# Patient Record
Sex: Female | Born: 1940 | Race: Black or African American | Hispanic: No | Smoking: Never smoker
Health system: Southern US, Community
[De-identification: ages and names within clinical notes are randomized; demographics above are authoritative.]

## PROBLEM LIST (undated history)

## (undated) DIAGNOSIS — I639 Cerebral infarction, unspecified: Secondary | ICD-10-CM

## (undated) DIAGNOSIS — F0390 Unspecified dementia without behavioral disturbance: Secondary | ICD-10-CM

---

## 2018-03-29 ENCOUNTER — Inpatient Hospital Stay (HOSPITAL_COMMUNITY)
Admission: EM | Admit: 2018-03-29 | Discharge: 2018-04-05 | DRG: 291 | Disposition: A | Discharge status: Skilled Nursing Facility | Payer: Medicare Other | Attending: Internal Medicine | Admitting: Internal Medicine

## 2018-03-29 ENCOUNTER — Encounter (HOSPITAL_COMMUNITY): Payer: Self-pay | Admitting: Emergency Medicine

## 2018-03-29 ENCOUNTER — Emergency Department (HOSPITAL_COMMUNITY): Payer: Medicare Other

## 2018-03-29 ENCOUNTER — Observation Stay (HOSPITAL_BASED_OUTPATIENT_CLINIC_OR_DEPARTMENT_OTHER): Payer: Medicare Other

## 2018-03-29 DIAGNOSIS — I370 Nonrheumatic pulmonary valve stenosis: Secondary | ICD-10-CM | POA: Diagnosis not present

## 2018-03-29 DIAGNOSIS — I5023 Acute on chronic systolic (congestive) heart failure: Secondary | ICD-10-CM

## 2018-03-29 DIAGNOSIS — L89152 Pressure ulcer of sacral region, stage 2: Secondary | ICD-10-CM | POA: Diagnosis present

## 2018-03-29 DIAGNOSIS — N179 Acute kidney failure, unspecified: Secondary | ICD-10-CM | POA: Diagnosis present

## 2018-03-29 DIAGNOSIS — I340 Nonrheumatic mitral (valve) insufficiency: Secondary | ICD-10-CM | POA: Diagnosis not present

## 2018-03-29 DIAGNOSIS — G92 Toxic encephalopathy: Secondary | ICD-10-CM | POA: Diagnosis present

## 2018-03-29 DIAGNOSIS — I10 Essential (primary) hypertension: Secondary | ICD-10-CM | POA: Diagnosis not present

## 2018-03-29 DIAGNOSIS — N182 Chronic kidney disease, stage 2 (mild): Secondary | ICD-10-CM | POA: Diagnosis present

## 2018-03-29 DIAGNOSIS — I130 Hypertensive heart and chronic kidney disease with heart failure and stage 1 through stage 4 chronic kidney disease, or unspecified chronic kidney disease: Secondary | ICD-10-CM | POA: Diagnosis not present

## 2018-03-29 DIAGNOSIS — I447 Left bundle-branch block, unspecified: Secondary | ICD-10-CM | POA: Diagnosis present

## 2018-03-29 DIAGNOSIS — N390 Urinary tract infection, site not specified: Secondary | ICD-10-CM | POA: Diagnosis present

## 2018-03-29 DIAGNOSIS — F0390 Unspecified dementia without behavioral disturbance: Secondary | ICD-10-CM | POA: Diagnosis present

## 2018-03-29 DIAGNOSIS — Z8673 Personal history of transient ischemic attack (TIA), and cerebral infarction without residual deficits: Secondary | ICD-10-CM

## 2018-03-29 DIAGNOSIS — E1122 Type 2 diabetes mellitus with diabetic chronic kidney disease: Secondary | ICD-10-CM | POA: Diagnosis present

## 2018-03-29 DIAGNOSIS — N289 Disorder of kidney and ureter, unspecified: Secondary | ICD-10-CM

## 2018-03-29 DIAGNOSIS — I081 Rheumatic disorders of both mitral and tricuspid valves: Secondary | ICD-10-CM | POA: Diagnosis present

## 2018-03-29 DIAGNOSIS — I5043 Acute on chronic combined systolic (congestive) and diastolic (congestive) heart failure: Secondary | ICD-10-CM | POA: Diagnosis present

## 2018-03-29 DIAGNOSIS — L89629 Pressure ulcer of left heel, unspecified stage: Secondary | ICD-10-CM | POA: Diagnosis present

## 2018-03-29 DIAGNOSIS — Z79899 Other long term (current) drug therapy: Secondary | ICD-10-CM

## 2018-03-29 DIAGNOSIS — E876 Hypokalemia: Secondary | ICD-10-CM | POA: Diagnosis not present

## 2018-03-29 DIAGNOSIS — E119 Type 2 diabetes mellitus without complications: Secondary | ICD-10-CM | POA: Diagnosis not present

## 2018-03-29 DIAGNOSIS — I509 Heart failure, unspecified: Secondary | ICD-10-CM

## 2018-03-29 DIAGNOSIS — I428 Other cardiomyopathies: Secondary | ICD-10-CM | POA: Diagnosis present

## 2018-03-29 DIAGNOSIS — D696 Thrombocytopenia, unspecified: Secondary | ICD-10-CM | POA: Diagnosis present

## 2018-03-29 DIAGNOSIS — Z7984 Long term (current) use of oral hypoglycemic drugs: Secondary | ICD-10-CM

## 2018-03-29 HISTORY — DX: Unspecified dementia, unspecified severity, without behavioral disturbance, psychotic disturbance, mood disturbance, and anxiety: F03.90

## 2018-03-29 HISTORY — DX: Cerebral infarction, unspecified: I63.9

## 2018-03-29 LAB — GLUCOSE, CAPILLARY: Glucose-Capillary: 142 mg/dL — ABNORMAL HIGH (ref 70–99)

## 2018-03-29 LAB — CBC WITH DIFFERENTIAL/PLATELET
Abs Immature Granulocytes: 0.01 10*3/uL (ref 0.00–0.07)
Basophils Absolute: 0 10*3/uL (ref 0.0–0.1)
Basophils Relative: 1 %
Eosinophils Absolute: 0 10*3/uL (ref 0.0–0.5)
Eosinophils Relative: 1 %
HCT: 41.8 % (ref 36.0–46.0)
HEMOGLOBIN: 12.5 g/dL (ref 12.0–15.0)
Immature Granulocytes: 0 %
Lymphocytes Relative: 14 %
Lymphs Abs: 0.7 10*3/uL (ref 0.7–4.0)
MCH: 30.7 pg (ref 26.0–34.0)
MCHC: 29.9 g/dL — ABNORMAL LOW (ref 30.0–36.0)
MCV: 102.7 fL — ABNORMAL HIGH (ref 80.0–100.0)
Monocytes Absolute: 0.4 10*3/uL (ref 0.1–1.0)
Monocytes Relative: 8 %
Neutro Abs: 3.6 10*3/uL (ref 1.7–7.7)
Neutrophils Relative %: 76 %
Platelets: 95 10*3/uL — ABNORMAL LOW (ref 150–400)
RBC: 4.07 MIL/uL (ref 3.87–5.11)
RDW: 18.8 % — ABNORMAL HIGH (ref 11.5–15.5)
WBC: 4.8 10*3/uL (ref 4.0–10.5)
nRBC: 0 % (ref 0.0–0.2)

## 2018-03-29 LAB — COMPREHENSIVE METABOLIC PANEL
ALK PHOS: 80 U/L (ref 38–126)
ALT: 9 U/L (ref 0–44)
AST: 15 U/L (ref 15–41)
Albumin: 2.8 g/dL — ABNORMAL LOW (ref 3.5–5.0)
Anion gap: 15 (ref 5–15)
BUN: 26 mg/dL — ABNORMAL HIGH (ref 8–23)
CO2: 20 mmol/L — AB (ref 22–32)
Calcium: 8.9 mg/dL (ref 8.9–10.3)
Chloride: 107 mmol/L (ref 98–111)
Creatinine, Ser: 1.79 mg/dL — ABNORMAL HIGH (ref 0.44–1.00)
GFR calc Af Amer: 31 mL/min — ABNORMAL LOW (ref >60)
GFR calc non Af Amer: 27 mL/min — ABNORMAL LOW (ref >60)
Glucose, Bld: 143 mg/dL — ABNORMAL HIGH (ref 70–99)
Potassium: 4.2 mmol/L (ref 3.5–5.1)
SODIUM: 142 mmol/L (ref 135–145)
Total Bilirubin: 2.3 mg/dL — ABNORMAL HIGH (ref 0.3–1.2)
Total Protein: 6.8 g/dL (ref 6.5–8.1)

## 2018-03-29 LAB — CBG MONITORING, ED
Glucose-Capillary: 116 mg/dL — ABNORMAL HIGH (ref 70–99)
Glucose-Capillary: 128 mg/dL — ABNORMAL HIGH (ref 70–99)

## 2018-03-29 LAB — CREATININE, URINE, RANDOM: Creatinine, Urine: 102.38 mg/dL

## 2018-03-29 LAB — URINALYSIS, ROUTINE W REFLEX MICROSCOPIC
BILIRUBIN URINE: NEGATIVE
Glucose, UA: NEGATIVE mg/dL
Ketones, ur: NEGATIVE mg/dL
Nitrite: NEGATIVE
PH: 7 (ref 5.0–8.0)
Protein, ur: 30 mg/dL — AB
Specific Gravity, Urine: 1.011 (ref 1.005–1.030)

## 2018-03-29 LAB — SODIUM, URINE, RANDOM: Sodium, Ur: 45 mmol/L

## 2018-03-29 LAB — TROPONIN I: Troponin I: <0.03 ng/mL (ref <0.03)

## 2018-03-29 LAB — BRAIN NATRIURETIC PEPTIDE: B Natriuretic Peptide: >4500 pg/mL — ABNORMAL HIGH (ref 0.0–100.0)

## 2018-03-29 MED ORDER — ACETAMINOPHEN 325 MG PO TABS: 650.0000 mg | ORAL_TABLET | ORAL | Status: DC | PRN

## 2018-03-29 MED ORDER — FUROSEMIDE 10 MG/ML IJ SOLN
40.0000 mg | Freq: Two times a day (BID) | INTRAMUSCULAR | Status: DC
  Administered 2018-03-29 – 2018-03-30 (×3): 40 mg via INTRAVENOUS
  Filled 2018-03-29 (×3): qty 4

## 2018-03-29 MED ORDER — SODIUM CHLORIDE 0.9% FLUSH: 3.0000 mL | INTRAVENOUS | Status: DC | PRN

## 2018-03-29 MED ORDER — CARVEDILOL 3.125 MG PO TABS
3.1250 mg | ORAL_TABLET | Freq: Two times a day (BID) | ORAL | Status: DC
  Administered 2018-03-29 – 2018-04-05 (×13): 3.125 mg via ORAL
  Filled 2018-03-29 (×15): qty 1

## 2018-03-29 MED ORDER — INSULIN ASPART 100 UNIT/ML ~~LOC~~ SOLN: 0.0000 [IU] | Freq: Every day | SUBCUTANEOUS | Status: DC

## 2018-03-29 MED ORDER — SODIUM CHLORIDE 0.9 % IV SOLN
1.0000 g | INTRAVENOUS | Status: DC
  Administered 2018-03-30 – 2018-04-01 (×3): 1 g via INTRAVENOUS
  Filled 2018-03-29: qty 10
  Filled 2018-03-29: qty 1
  Filled 2018-03-29: qty 10

## 2018-03-29 MED ORDER — SODIUM CHLORIDE 0.9 % IV SOLN: 250.0000 mL | INTRAVENOUS | Status: DC | PRN

## 2018-03-29 MED ORDER — ONDANSETRON HCL 4 MG/2ML IJ SOLN: 4.0000 mg | Freq: Four times a day (QID) | INTRAMUSCULAR | Status: DC | PRN

## 2018-03-29 MED ORDER — SODIUM CHLORIDE 0.9 % IV SOLN
1.0000 g | Freq: Once | INTRAVENOUS | Status: AC
  Administered 2018-03-29: 1 g via INTRAVENOUS
  Filled 2018-03-29: qty 10

## 2018-03-29 MED ORDER — DONEPEZIL HCL 5 MG PO TABS
5.0000 mg | ORAL_TABLET | Freq: Every day | ORAL | Status: DC
  Administered 2018-03-30 – 2018-04-04 (×6): 5 mg via ORAL
  Filled 2018-03-29 (×7): qty 1

## 2018-03-29 MED ORDER — INSULIN ASPART 100 UNIT/ML ~~LOC~~ SOLN
0.0000 [IU] | Freq: Three times a day (TID) | SUBCUTANEOUS | Status: DC
  Administered 2018-03-29 – 2018-03-31 (×3): 1 [IU] via SUBCUTANEOUS
  Administered 2018-03-31 – 2018-04-02 (×3): 2 [IU] via SUBCUTANEOUS
  Administered 2018-04-03 – 2018-04-04 (×4): 1 [IU] via SUBCUTANEOUS

## 2018-03-29 MED ORDER — SODIUM CHLORIDE 0.9% FLUSH
3.0000 mL | Freq: Two times a day (BID) | INTRAVENOUS | Status: DC
  Administered 2018-03-29 – 2018-04-05 (×14): 3 mL via INTRAVENOUS

## 2018-03-29 MED ORDER — FUROSEMIDE 10 MG/ML IJ SOLN
40.0000 mg | Freq: Once | INTRAMUSCULAR | Status: AC
  Administered 2018-03-29: 40 mg via INTRAVENOUS
  Filled 2018-03-29: qty 4

## 2018-03-29 NOTE — Procedures (Signed)
Echo attempted. Patient just transferred to room. Patient agitated as nurse is attempted to put leads for heart monitor on her. Will attempt again later.

## 2018-03-29 NOTE — Progress Notes (Signed)
  Echocardiogram 2D Echocardiogram has been performed.  Kelly Valencia Kelly Valencia 03/29/2018, 4:50 PM

## 2018-03-29 NOTE — ED Notes (Signed)
bfast tray ordered 

## 2018-03-29 NOTE — ED Notes (Signed)
IV team consult ordered , unable to access peripheral IV due to poorly visible/minute veins .

## 2018-03-29 NOTE — Progress Notes (Signed)
Patient seen and examined, admitted this morning, H&P reviewed and agree with the assessment and plan.  In brief, this is a 77 year old female originally from [place] but now visiting the daughter in town, history of CHF, hypertension, diabetes, dementia who was brought by the daughter to the hospital due to increased confusion and lethargy.  She was found to have significant fluid overload in the ED as well as a UTI for which she was admitted.   BP 125/89 (BP Location: Left Arm)   Pulse 76   Resp 12   SpO2 98%  Has underlying dementia, appears in no distress, heart is regular, has significant pitting lower extremity edema.  Lungs are clear on anterior fields.  Acute on chronic CHF, unknown type -2D echo pending, continue IV Lasix, daily weights, I/O  UTI -Continue ceftriaxone, urine cultures are pending  Acute kidney injury -Creatinine 1.8 on admission, up from about 1 year ago, daughter tells me that at 1 point her kidneys "shut down" but they recovered so suspect a degree of chronic kidney disease, unknown stage  Thrombocytopenia -Monitor, unknown chronicity   Hypertension -Continue Coreg, she appears to be on ACE inhibitor, Entresto and losartan, unclear which when she is actually taking but strongly suspect Entresto.  Hold for now since she is getting diuresed  Type 2 diabetes mellitus, control undetermined -Continue sliding scale  Geran Haithcock M. Elvera Lennox, MD, PhD [facility] Pager [phone]  If 7PM-7AM, please contact night-coverage [email or web address] Password [email or web address]

## 2018-03-29 NOTE — ED Notes (Signed)
IV nurse at bedside attempting to insert peripheral IV .

## 2018-03-29 NOTE — ED Provider Notes (Addendum)
[facility] EMERGENCY DEPARTMENT Provider Note   CSN: [id number] Arrival date & time: 03/29/18  0024     History   Chief Complaint Chief Complaint  Patient presents with  . Generalized Weakness  . Dizziness    HPI Kelly Valencia is a 77 y.o. female.  Level 5 caveat for dementia.  Patient does not know why she is here.  She is oriented to person and place.  EMS reports family called for a 2-day history of dizziness and lightheadedness with generalized weakness.  Patient denies any falls.  No headache, neck pain, back pain, chest pain or abdominal pain.  Denies any nausea or vomiting.  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  No acute change in this today.  No urinary symptoms.  No other history available.  Patient's daughter has arrived.  She states the patient has had increasing generalized weakness and decreased urine output and appetite for the past 2 days.  She is more confused than usual and she is having difficulty trying to go to the bathroom and not wanting to eat or drink.  Daughter reports history of CHF, hypertension and previous diabetes.  Denies history of stroke.  Moved here from [place] on December 7.  The history is provided by the patient and the EMS personnel. The history is limited by the condition of the patient.    Past Medical History:  Diagnosis Date  . Dementia (HCC)   . Stroke [facility])     There are no active problems to display for this patient.   History reviewed. No pertinent surgical history.   OB History   No obstetric history on file.      Home Medications    Prior to Admission medications   Not on File    Family History No family history on file.  Social History Social History   Tobacco Use  . Smoking status: Never Smoker  . Smokeless tobacco: Never Used  Substance Use Topics  . Alcohol use: Never    Frequency: Never  . Drug use: Never     Allergies   Patient has no known  allergies.   Review of Systems Review of Systems  Unable to perform ROS: Dementia     Physical Exam Updated Vital Signs BP 126/88 (BP Location: Right Arm)   Pulse 88   Resp 18   SpO2 100%   Physical Exam Vitals signs and nursing note reviewed.  Constitutional:      General: She is not in acute distress.    Appearance: She is well-developed.  HENT:     Head: Normocephalic and atraumatic.     Mouth/Throat:     Pharynx: No oropharyngeal exudate.  Eyes:     Conjunctiva/sclera: Conjunctivae normal.     Comments: Left cornea chronically opacified  Neck:     Musculoskeletal: Normal range of motion and neck supple.     Comments: No meningismus. Cardiovascular:     Rate and Rhythm: Normal rate and regular rhythm.     Heart sounds: Normal heart sounds. No murmur.  Pulmonary:     Effort: Pulmonary effort is normal. No respiratory distress.     Breath sounds: Normal breath sounds.     Comments: Bibasilar crackles Abdominal:     Palpations: Abdomen is soft.     Tenderness: There is no abdominal tenderness. There is no guarding or rebound.     Comments: Induration to bilateral flanks without discrete mass or abscess or tenderness  Musculoskeletal:  Normal range of motion.        General: No tenderness.     Right lower leg: Edema present.     Left lower leg: Edema present.     Comments: Diffuse body wall edema  Skin:    General: Skin is warm.     Capillary Refill: Capillary refill takes less than 2 seconds.  Neurological:     Mental Status: She is alert.     Cranial Nerves: No cranial nerve deficit.     Motor: No abnormal muscle tone.     Coordination: Coordination normal.     Comments: Oriented to person and place.  Cranial nerves II to XII intact, 5/5 strength throughout.  Psychiatric:        Behavior: Behavior normal.      ED Treatments / Results  Labs (all labs ordered are listed, but only abnormal results are displayed) Labs Reviewed  CBC WITH  DIFFERENTIAL/PLATELET - Abnormal; Notable for the following components:      Result Value   MCV 102.7 (*)    MCHC 29.9 (*)    RDW 18.8 (*)    Platelets 95 (*)    All other components within normal limits  COMPREHENSIVE METABOLIC PANEL - Abnormal; Notable for the following components:   CO2 20 (*)    Glucose, Bld 143 (*)    BUN 26 (*)    Creatinine, Ser 1.79 (*)    Albumin 2.8 (*)    Total Bilirubin 2.3 (*)    GFR calc non Af Amer 27 (*)    GFR calc Af Amer 31 (*)    All other components within normal limits  URINALYSIS, ROUTINE W REFLEX MICROSCOPIC - Abnormal; Notable for the following components:   Color, Urine AMBER (*)    APPearance CLOUDY (*)    Hgb urine dipstick MODERATE (*)    Protein, ur 30 (*)    Leukocytes, UA LARGE (*)    WBC, UA >50 (*)    Bacteria, UA MANY (*)    Non Squamous Epithelial 0-5 (*)    All other components within normal limits  BRAIN NATRIURETIC PEPTIDE - Abnormal; Notable for the following components:   B Natriuretic Peptide >4,500.0 (*)    All other components within normal limits  URINE CULTURE  TROPONIN I  SODIUM, URINE, RANDOM  CREATININE, URINE, RANDOM  UREA NITROGEN, URINE    EKG EKG Interpretation  Date/Time:  Tuesday March 29 2018 00:27:25 EST Ventricular Rate:  85 PR Interval:  128 QRS Duration: 130 QT Interval:  432 QTC Calculation: 514 R Axis:   51 Text Interpretation:  Normal sinus rhythm Left bundle branch block Abnormal ECG No previous ECGs available Confirmed by Glynn Octave [phone]) on 03/29/2018 12:41:32 AM   Radiology Ct Abdomen Pelvis Wo Contrast  Result Date: 03/29/2018 CLINICAL DATA:  Abdominal distension. EXAM: CT ABDOMEN AND PELVIS WITHOUT CONTRAST TECHNIQUE: Multidetector CT imaging of the abdomen and pelvis was performed following the standard protocol without IV contrast. COMPARISON:  None. FINDINGS: Lower chest: Moderate bilateral pleural effusions with compressive atelectasis in the lower lobes. Opacity  in the lingula has the appearance of atelectasis. Multi chamber cardiomegaly with coronary artery calcifications. Hepatobiliary: No discrete focal hepatic lesion on noncontrast exam. High-density material in the region of the gallbladder fossa are presumed gallstones, however gallbladder is not well-defined. Pancreas: Streak artifact from overlying artifact obscures the pancreas. Spleen: Normal in size. Adrenals/Urinary Tract: Mild left adrenal thickening without nodule. The right adrenal gland is normal. Bilateral  renal parenchymal thinning. No hydronephrosis. Streak artifact from external artifact partially obscures the left kidney. Foley catheter decompresses the urinary bladder. Stomach/Bowel: Bowel evaluation is limited in the absence of contrast, streak artifact, and patient motion artifact. No evidence of bowel obstruction or inflammatory change. Normal appendix. Small to moderate colonic stool burden. Vascular/Lymphatic: Aortic atherosclerosis. No aneurysm. Significantly limited assessment for adenopathy. Reproductive: Uterus is unremarkable.  No gross adnexal mass. Other: Marked diffuse body wall edema and edema throughout the abdominal mesentery. Probable small volume ascites. No free air. Musculoskeletal: T12 and L3 superior endplate compression fractures, likely chronic. Multilevel degenerative change in the spine. There are no acute or suspicious osseous abnormalities. IMPRESSION: 1. Marked body wall and soft tissue edema. Bilateral pleural effusions, at least moderate in degree. Trace ascites. Findings consistent with third spacing or fluid overload. 2. Probable gallstones, gallbladder is not well-defined. 3.  Aortic Atherosclerosis (ICD10-I70.0). Electronically Signed   By: Narda Rutherford M.D.   On: 03/29/2018 05:40   Dg Chest 2 View  Result Date: 03/29/2018 CLINICAL DATA:  Weakness. EXAM: CHEST - 2 VIEW COMPARISON:  None. FINDINGS: The heart is enlarged. There are small to moderate bilateral  pleural effusions. Vascular congestion without overt pulmonary edema. No focal airspace disease. No pneumothorax. The bones are under mineralized. IMPRESSION: Cardiomegaly with small to moderate bilateral pleural effusions and vascular congestion. Findings consistent with fluid overload/CHF. Electronically Signed   By: Narda Rutherford M.D.   On: 03/29/2018 01:41   Ct Head Wo Contrast  Result Date: 03/29/2018 CLINICAL DATA:  Weakness and dizziness. Altered level of consciousness (LOC), unexplained; Polytrauma, critical, head/C-spine injury suspected EXAM: CT HEAD WITHOUT CONTRAST CT CERVICAL SPINE WITHOUT CONTRAST TECHNIQUE: Multidetector CT imaging of the head and cervical spine was performed following the standard protocol without intravenous contrast. Multiplanar CT image reconstructions of the cervical spine were also generated. COMPARISON:  None. FINDINGS: CT HEAD FINDINGS Brain: Generalized atrophy. Moderate to advanced chronic small vessel ischemia. Remote lacunar infarct in the left cerebellum and basal ganglia. No intracranial hemorrhage, mass effect, or midline shift. No hydrocephalus. The basilar cisterns are patent. No evidence of territorial infarct or acute ischemia. No extra-axial or intracranial fluid collection. Vascular: Atherosclerosis of skullbase vasculature without hyperdense vessel or abnormal calcification. Skull: No fracture or suspicious lesion. Probable focal hyperostosis in the right frontal region. Sinuses/Orbits: Bilateral globe deformity, more prominent on the left with intra-ocular high-density material, calcifications versus surgical material. Mild mucosal thickening of the paranasal sinuses. The mastoid air cells are clear. Postsurgical change of the nose. Other: None. CT CERVICAL SPINE FINDINGS Alignment: No traumatic subluxation. Trace anterolisthesis of C4 on C5 appears degenerative. Skull base and vertebrae: No acute fracture. Vertebral body heights are maintained. The dens  and skull base are intact. Soft tissues and spinal canal: No spinal canal hematoma. Fluid in the hypopharynx. Disc levels: Disc space narrowing and endplate spurring Z6-X0. Multilevel facet arthropathy. Upper chest: Large bilateral pleural effusions. Other: Carotid calcifications. IMPRESSION: 1. No acute intracranial abnormality. No skull fracture. 2. Generalized atrophy and chronic small vessel ischemia. 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 3. Degenerative change in the cervical spine without acute fracture or subluxation. 4. Bilateral pleural effusions, as seen on chest radiograph earlier this day. Electronically Signed   By: Narda Rutherford M.D.   On: 03/29/2018 05:11   Ct Cervical Spine Wo Contrast  Result Date: 03/29/2018 CLINICAL DATA:  Weakness and dizziness. Altered level of consciousness (LOC), unexplained; Polytrauma, critical, head/C-spine injury suspected  EXAM: CT HEAD WITHOUT CONTRAST CT CERVICAL SPINE WITHOUT CONTRAST TECHNIQUE: Multidetector CT imaging of the head and cervical spine was performed following the standard protocol without intravenous contrast. Multiplanar CT image reconstructions of the cervical spine were also generated. COMPARISON:  None. FINDINGS: CT HEAD FINDINGS Brain: Generalized atrophy. Moderate to advanced chronic small vessel ischemia. Remote lacunar infarct in the left cerebellum and basal ganglia. No intracranial hemorrhage, mass effect, or midline shift. No hydrocephalus. The basilar cisterns are patent. No evidence of territorial infarct or acute ischemia. No extra-axial or intracranial fluid collection. Vascular: Atherosclerosis of skullbase vasculature without hyperdense vessel or abnormal calcification. Skull: No fracture or suspicious lesion. Probable focal hyperostosis in the right frontal region. Sinuses/Orbits: Bilateral globe deformity, more prominent on the left with intra-ocular high-density material, calcifications versus  surgical material. Mild mucosal thickening of the paranasal sinuses. The mastoid air cells are clear. Postsurgical change of the nose. Other: None. CT CERVICAL SPINE FINDINGS Alignment: No traumatic subluxation. Trace anterolisthesis of C4 on C5 appears degenerative. Skull base and vertebrae: No acute fracture. Vertebral body heights are maintained. The dens and skull base are intact. Soft tissues and spinal canal: No spinal canal hematoma. Fluid in the hypopharynx. Disc levels: Disc space narrowing and endplate spurring Z6-X0. Multilevel facet arthropathy. Upper chest: Large bilateral pleural effusions. Other: Carotid calcifications. IMPRESSION: 1. No acute intracranial abnormality. No skull fracture. 2. Generalized atrophy and chronic small vessel ischemia. 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 3. Degenerative change in the cervical spine without acute fracture or subluxation. 4. Bilateral pleural effusions, as seen on chest radiograph earlier this day. Electronically Signed   By: Narda Rutherford M.D.   On: 03/29/2018 05:11    Procedures Procedures (including critical care time)  Medications Ordered in ED Medications - No data to display   Initial Impression / Assessment and Plan / ED Course  I have reviewed the triage vital signs and the nursing notes.  Pertinent labs & imaging results that were available during my care of the patient were reviewed by me and considered in my medical decision making (see chart for details).    Patient from home with generalized weakness, increased confusion, decreased urine output.  Daughter concerned about UTI.  Denies chest pain.  Appears to be volume overloaded on exam with anasarca.  She does not take any diuretics at home for unclear reasons.Labs show creatinine of 1.8, no baseline creatinine available.  Urinalysis consistent with UTI.  Will start Rocephin and send culture.  Chest x-ray confirms edema.  Will start IV  Lasix.  CT scan confirms body wall edema without acute pathology.  Patient will be admitted for IV diuresis and IV antibiotics for her UTI and encephalopathy.  CT head is stable.  Admission discussed with Dr. Antionette Char.  CRITICAL CARE Performed by: Glynn Octave Total critical care time: 31 minutes Critical care time was exclusive of separately billable procedures and treating other patients. Critical care was necessary to treat or prevent imminent or life-threatening deterioration. Critical care was time spent personally by me on the following activities: development of treatment plan with patient and/or surrogate as well as nursing, discussions with consultants, evaluation of patient's response to treatment, examination of patient, obtaining history from patient or surrogate, ordering and performing treatments and interventions, ordering and review of laboratory studies, ordering and review of radiographic studies, pulse oximetry and re-evaluation of patient's condition.   Final Clinical Impressions(s) / ED Diagnoses   Final diagnoses:  Acute on chronic  congestive heart failure, unspecified heart failure type [facility])  Urinary tract infection without hematuria, site unspecified    ED Discharge Orders    None       Carliyah Cotterman, Jeannett Senior, MD 03/29/18 0981    Glynn Octave, MD 04/14/18 [phone]

## 2018-03-29 NOTE — H&P (Signed)
History and Physical    Kelly Valencia WUJ:[id number] DOB: June 30, 1940 DOA: 03/29/2018  PCP: System, Pcp Not In   Patient coming from: Home   Chief Complaint: Increased confusion, lethargy    HPI: Kelly Valencia is a 77 y.o. female with medical history significant for CHF, hypertension, type 2 diabetes mellitus, and dementia, now presenting to the emergency department for evaluation of increased confusion and lethargy.  Patient lives with a daughter in [place], is here visiting her other daughter and was noted to be sleeping 20-22 hours per day since arriving in [place] on 03/19/18.  She has not been voicing any specific complaints.  Per the report of her daughter, she did has eaten very little and did not eat or drink anything 2 days ago. She did not urinate at all 2 days ago and urine yesterday was very dark and malodorous.  There was no recent fall or trauma reported.  Patient is not known to use alcohol or illicit substances.  ED Course: Upon arrival to the ED, patient is found to be saturating adequately on room air and with vitals otherwise stable.  EKG features a sinus rhythm with LBBB.  Chest x-ray is notable for small to moderate bilateral pleural effusions and vascular congestion consistent with CHF.  Chemistry panel is notable for creatinine 1.79, up from roughly 1 a year ago, and total bilirubin of 2.3.  CBC features a thrombocytopenia with platelets 95,000.  BNP is >4500 and troponin undetectable.  CT head is negative for acute findings.  CT abdomen and pelvis reveals anasarca and possible gallstones.  Patient was given 40 mg IV Lasix and Rocephin in the ED.  Urine was sent for culture.  She remains hemodynamically stable and will be observed in the hospital for ongoing evaluation and management.  Review of Systems:  All other systems reviewed and apart from HPI, are negative.  Past Medical History:  Diagnosis Date  . Dementia (HCC)   . Stroke [facility])     History reviewed.  No pertinent surgical history.   reports that she has never smoked. She has never used smokeless tobacco. She reports that she does not drink alcohol or use drugs.  No Known Allergies  Family History  Problem Relation Age of Onset  . Developmental delay Son      Prior to Admission medications   Medication Sig Start Date End Date Taking? Authorizing Provider  carvedilol (COREG) 3.125 MG tablet Take 3.125 mg by mouth 2 (two) times daily with a meal.   Yes [provider]  donepezil (ARICEPT) 5 MG tablet Take 5 mg by mouth at bedtime.   Yes [provider]  furosemide (LASIX) 40 MG tablet Take 40 mg by mouth daily.   Yes [provider]  lisinopril (PRINIVIL,ZESTRIL) 2.5 MG tablet Take 2.5 mg by mouth daily.   Yes [provider]  losartan (COZAAR) 50 MG tablet Take 50 mg by mouth daily.   Yes [provider]  metFORMIN (GLUCOPHAGE-XR) 500 MG 24 hr tablet Take 500 mg by mouth 2 (two) times daily.   Yes [provider]  sacubitril-valsartan (ENTRESTO) 49-51 MG Take 1 tablet by mouth 2 (two) times daily.   Yes [provider]    Physical Exam: Vitals:   03/29/18 0309 03/29/18 0309 03/29/18 0425 03/29/18 0601  BP: 118/79 118/79 119/86 113/65  Pulse: 86 94 78 74  Resp: (!) 25 20 18 17   SpO2: 100% 99% 100% 96%    Constitutional: NAD, somnolent  Eyes:  PERTLA, lids and conjunctivae normal ENMT: Mucous membranes are moist. Posterior pharynx clear of any exudate or lesions.   Neck: normal, supple, no masses, no thyromegaly Respiratory: clear to auscultation bilaterally, no wheezing, no crackles. Normal respiratory effort.   Cardiovascular: S1 & S2 heard, regular rate and rhythm. Bilateral leg edema. Neck veins distended throughout their visible course.   Abdomen: No distension, no tenderness, soft. Bowel sounds active.  Musculoskeletal: no clubbing / cyanosis. No joint deformity upper and lower extremities.    Skin: no  significant rashes, lesions, ulcers. Bullae at left heel. Neurologic: No facial asymmetry. Sensation intact. Moving all extremities.  Psychiatric: Somnolent, easily woken. Disoriented.    Labs on Admission: I have personally reviewed following labs and imaging studies  CBC: Recent Labs  Lab 03/29/18 0040  WBC 4.8  NEUTROABS 3.6  HGB 12.5  HCT 41.8  MCV 102.7*  PLT 95*   Basic Metabolic Panel: Recent Labs  Lab 03/29/18 0040  NA 142  K 4.2  CL 107  CO2 20*  GLUCOSE 143*  BUN 26*  CREATININE 1.79*  CALCIUM 8.9   GFR: CrCl cannot be calculated (Unknown ideal weight.). Liver Function Tests: Recent Labs  Lab 03/29/18 0040  AST 15  ALT 9  ALKPHOS 80  BILITOT 2.3*  PROT 6.8  ALBUMIN 2.8*   No results for input(s): LIPASE, AMYLASE in the last 168 hours. No results for input(s): AMMONIA in the last 168 hours. Coagulation Profile: No results for input(s): INR, PROTIME in the last 168 hours. Cardiac Enzymes: Recent Labs  Lab 03/29/18 0040  TROPONINI <0.03   BNP (last 3 results) No results for input(s): PROBNP in the last 8760 hours. HbA1C: No results for input(s): HGBA1C in the last 72 hours. CBG: No results for input(s): GLUCAP in the last 168 hours. Lipid Profile: No results for input(s): CHOL, HDL, LDLCALC, TRIG, CHOLHDL, LDLDIRECT in the last 72 hours. Thyroid Function Tests: No results for input(s): TSH, T4TOTAL, FREET4, T3FREE, THYROIDAB in the last 72 hours. Anemia Panel: No results for input(s): VITAMINB12, FOLATE, FERRITIN, TIBC, IRON, RETICCTPCT in the last 72 hours. Urine analysis:    Component Value Date/Time   COLORURINE AMBER (A) 03/29/2018 0245   APPEARANCEUR CLOUDY (A) 03/29/2018 0245   LABSPEC 1.011 03/29/2018 0245   PHURINE 7.0 03/29/2018 0245   GLUCOSEU NEGATIVE 03/29/2018 0245   HGBUR MODERATE (A) 03/29/2018 0245   BILIRUBINUR NEGATIVE 03/29/2018 0245   KETONESUR NEGATIVE 03/29/2018 0245   PROTEINUR 30 (A) 03/29/2018 0245   NITRITE  NEGATIVE 03/29/2018 0245   LEUKOCYTESUR LARGE (A) 03/29/2018 0245   Sepsis Labs: @LABRCNTIP (procalcitonin:4,lacticidven:4) )No results found for this or any previous visit (from the past 240 hour(s)).   Radiological Exams on Admission: Ct Abdomen Pelvis Wo Contrast  Result Date: 03/29/2018 CLINICAL DATA:  Abdominal distension. EXAM: CT ABDOMEN AND PELVIS WITHOUT CONTRAST TECHNIQUE: Multidetector CT imaging of the abdomen and pelvis was performed following the standard protocol without IV contrast. COMPARISON:  None. FINDINGS: Lower chest: Moderate bilateral pleural effusions with compressive atelectasis in the lower lobes. Opacity in the lingula has the appearance of atelectasis. Multi chamber cardiomegaly with coronary artery calcifications. Hepatobiliary: No discrete focal hepatic lesion on noncontrast exam. High-density material in the region of the gallbladder fossa are presumed gallstones, however gallbladder is not well-defined. Pancreas: Streak artifact from overlying artifact obscures the pancreas. Spleen: Normal in size. Adrenals/Urinary Tract: Mild left adrenal thickening without nodule. The right adrenal gland is normal. Bilateral renal parenchymal thinning. No hydronephrosis. Streak artifact  from external artifact partially obscures the left kidney. Foley catheter decompresses the urinary bladder. Stomach/Bowel: Bowel evaluation is limited in the absence of contrast, streak artifact, and patient motion artifact. No evidence of bowel obstruction or inflammatory change. Normal appendix. Small to moderate colonic stool burden. Vascular/Lymphatic: Aortic atherosclerosis. No aneurysm. Significantly limited assessment for adenopathy. Reproductive: Uterus is unremarkable.  No gross adnexal mass. Other: Marked diffuse body wall edema and edema throughout the abdominal mesentery. Probable small volume ascites. No free air. Musculoskeletal: T12 and L3 superior endplate compression fractures, likely  chronic. Multilevel degenerative change in the spine. There are no acute or suspicious osseous abnormalities. IMPRESSION: 1. Marked body wall and soft tissue edema. Bilateral pleural effusions, at least moderate in degree. Trace ascites. Findings consistent with third spacing or fluid overload. 2. Probable gallstones, gallbladder is not well-defined. 3.  Aortic Atherosclerosis (ICD10-I70.0). Electronically Signed   By: Narda Rutherford M.D.   On: 03/29/2018 05:40   Dg Chest 2 View  Result Date: 03/29/2018 CLINICAL DATA:  Weakness. EXAM: CHEST - 2 VIEW COMPARISON:  None. FINDINGS: The heart is enlarged. There are small to moderate bilateral pleural effusions. Vascular congestion without overt pulmonary edema. No focal airspace disease. No pneumothorax. The bones are under mineralized. IMPRESSION: Cardiomegaly with small to moderate bilateral pleural effusions and vascular congestion. Findings consistent with fluid overload/CHF. Electronically Signed   By: Narda Rutherford M.D.   On: 03/29/2018 01:41   Ct Head Wo Contrast  Result Date: 03/29/2018 CLINICAL DATA:  Weakness and dizziness. Altered level of consciousness (LOC), unexplained; Polytrauma, critical, head/C-spine injury suspected EXAM: CT HEAD WITHOUT CONTRAST CT CERVICAL SPINE WITHOUT CONTRAST TECHNIQUE: Multidetector CT imaging of the head and cervical spine was performed following the standard protocol without intravenous contrast. Multiplanar CT image reconstructions of the cervical spine were also generated. COMPARISON:  None. FINDINGS: CT HEAD FINDINGS Brain: Generalized atrophy. Moderate to advanced chronic small vessel ischemia. Remote lacunar infarct in the left cerebellum and basal ganglia. No intracranial hemorrhage, mass effect, or midline shift. No hydrocephalus. The basilar cisterns are patent. No evidence of territorial infarct or acute ischemia. No extra-axial or intracranial fluid collection. Vascular: Atherosclerosis of skullbase  vasculature without hyperdense vessel or abnormal calcification. Skull: No fracture or suspicious lesion. Probable focal hyperostosis in the right frontal region. Sinuses/Orbits: Bilateral globe deformity, more prominent on the left with intra-ocular high-density material, calcifications versus surgical material. Mild mucosal thickening of the paranasal sinuses. The mastoid air cells are clear. Postsurgical change of the nose. Other: None. CT CERVICAL SPINE FINDINGS Alignment: No traumatic subluxation. Trace anterolisthesis of C4 on C5 appears degenerative. Skull base and vertebrae: No acute fracture. Vertebral body heights are maintained. The dens and skull base are intact. Soft tissues and spinal canal: No spinal canal hematoma. Fluid in the hypopharynx. Disc levels: Disc space narrowing and endplate spurring O9-G2. Multilevel facet arthropathy. Upper chest: Large bilateral pleural effusions. Other: Carotid calcifications. IMPRESSION: 1. No acute intracranial abnormality. No skull fracture. 2. Generalized atrophy and chronic small vessel ischemia. 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 3. Degenerative change in the cervical spine without acute fracture or subluxation. 4. Bilateral pleural effusions, as seen on chest radiograph earlier this day. Electronically Signed   By: Narda Rutherford M.D.   On: 03/29/2018 05:11   Ct Cervical Spine Wo Contrast  Result Date: 03/29/2018 CLINICAL DATA:  Weakness and dizziness. Altered level of consciousness (LOC), unexplained; Polytrauma, critical, head/C-spine injury suspected EXAM: CT HEAD WITHOUT CONTRAST CT CERVICAL  SPINE WITHOUT CONTRAST TECHNIQUE: Multidetector CT imaging of the head and cervical spine was performed following the standard protocol without intravenous contrast. Multiplanar CT image reconstructions of the cervical spine were also generated. COMPARISON:  None. FINDINGS: CT HEAD FINDINGS Brain: Generalized atrophy. Moderate to  advanced chronic small vessel ischemia. Remote lacunar infarct in the left cerebellum and basal ganglia. No intracranial hemorrhage, mass effect, or midline shift. No hydrocephalus. The basilar cisterns are patent. No evidence of territorial infarct or acute ischemia. No extra-axial or intracranial fluid collection. Vascular: Atherosclerosis of skullbase vasculature without hyperdense vessel or abnormal calcification. Skull: No fracture or suspicious lesion. Probable focal hyperostosis in the right frontal region. Sinuses/Orbits: Bilateral globe deformity, more prominent on the left with intra-ocular high-density material, calcifications versus surgical material. Mild mucosal thickening of the paranasal sinuses. The mastoid air cells are clear. Postsurgical change of the nose. Other: None. CT CERVICAL SPINE FINDINGS Alignment: No traumatic subluxation. Trace anterolisthesis of C4 on C5 appears degenerative. Skull base and vertebrae: No acute fracture. Vertebral body heights are maintained. The dens and skull base are intact. Soft tissues and spinal canal: No spinal canal hematoma. Fluid in the hypopharynx. Disc levels: Disc space narrowing and endplate spurring Z6-X0. Multilevel facet arthropathy. Upper chest: Large bilateral pleural effusions. Other: Carotid calcifications. IMPRESSION: 1. No acute intracranial abnormality. No skull fracture. 2. Generalized atrophy and chronic small vessel ischemia. 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 3. Degenerative change in the cervical spine without acute fracture or subluxation. 4. Bilateral pleural effusions, as seen on chest radiograph earlier this day. Electronically Signed   By: Narda Rutherford M.D.   On: 03/29/2018 05:11    EKG: Independently reviewed. Sinus rhythm, LBBB.   Assessment/Plan   1. Acute on chronic CHF  - Presents with increased confusion and lethargy, found to have anasarca and UTI  - She has hx of CHF, but no echo  reports on file  - Treated in ED with Lasix 40 mg IV    - Continue diuresis with Lasix 40 mg IV q12h, continue cardiac monitoring, follow daily wt and I/O's, continue Coreg, hold ACE/ARB/Entresto in light of elevated creatinine with uncertain baseline, and check echocardiogram    2. UTI  - Presents with increased confusion, lethargy, and urinary retention  - Found to be in acute CHF with urinary retention and UA suggestive of UTI in this setting  - Foley was placed in ED, urine was sent for culture, and she was treated with empiric Rocephin  - Continue current antibiotic while following culture and clinical course   3. Renal insufficiency  - SCr is 1.79 on admission, up from ~1 a year ago  - No hydronephrosis on CT  - Check urine chemistries, hold ACE/ARB, renally-dose medications, follow daily chem panel during diuresis    4. Thrombocytopenia  - Platelets 95,000 on admission, worse than priors  - No bleeding identified  - Drop possibly from infection, possible nutritional deficiency  - Monitor    5. Hypertension  - BP at goal  - Continue Coreg, clarify ACE vs ARB vs Entresto as daughter reports patient taking all 3    6. Type II DM  - A1c was 10.7% in 2017  - Managed at home with metformin, held on admission  - Check CBG's and use a low-intensity SSI with Novolog while in hospital     DVT prophylaxis: SCD's  Code Status: Full  Family Communication: Daughter updated at bedside Consults called: None Admission status:  Observation    Briscoe Deutscher, MD [facility] Pager [phone]  If 7PM-7AM, please contact night-coverage [email or web address] Password [facility]  03/29/2018, 6:23 AM

## 2018-03-29 NOTE — ED Triage Notes (Signed)
Patient arrived with EMS from home reports generalized weakness with mild dizziness onset Saturday , history of Dementia and stroke with right sided deficits , denies pain/respirations unlabored .

## 2018-03-29 NOTE — ED Notes (Signed)
Patient transported to CT scan . 

## 2018-03-29 NOTE — ED Notes (Signed)
Unable to do Orthostatic Vitals. Pt unable to sit up on side of bed nor stand. Pt's legs are swollen and painful.

## 2018-03-29 NOTE — ED Notes (Signed)
Patient currently at X-ray .  

## 2018-03-30 DIAGNOSIS — E1122 Type 2 diabetes mellitus with diabetic chronic kidney disease: Secondary | ICD-10-CM | POA: Diagnosis present

## 2018-03-30 DIAGNOSIS — I428 Other cardiomyopathies: Secondary | ICD-10-CM | POA: Diagnosis present

## 2018-03-30 DIAGNOSIS — Z79899 Other long term (current) drug therapy: Secondary | ICD-10-CM | POA: Diagnosis not present

## 2018-03-30 DIAGNOSIS — I447 Left bundle-branch block, unspecified: Secondary | ICD-10-CM | POA: Diagnosis present

## 2018-03-30 DIAGNOSIS — Z8673 Personal history of transient ischemic attack (TIA), and cerebral infarction without residual deficits: Secondary | ICD-10-CM | POA: Diagnosis not present

## 2018-03-30 DIAGNOSIS — I5023 Acute on chronic systolic (congestive) heart failure: Secondary | ICD-10-CM | POA: Diagnosis not present

## 2018-03-30 DIAGNOSIS — N179 Acute kidney failure, unspecified: Secondary | ICD-10-CM | POA: Diagnosis present

## 2018-03-30 DIAGNOSIS — I509 Heart failure, unspecified: Secondary | ICD-10-CM

## 2018-03-30 DIAGNOSIS — G309 Alzheimer's disease, unspecified: Secondary | ICD-10-CM | POA: Diagnosis not present

## 2018-03-30 DIAGNOSIS — E119 Type 2 diabetes mellitus without complications: Secondary | ICD-10-CM | POA: Diagnosis not present

## 2018-03-30 DIAGNOSIS — I10 Essential (primary) hypertension: Secondary | ICD-10-CM | POA: Diagnosis not present

## 2018-03-30 DIAGNOSIS — L89629 Pressure ulcer of left heel, unspecified stage: Secondary | ICD-10-CM

## 2018-03-30 DIAGNOSIS — L89152 Pressure ulcer of sacral region, stage 2: Secondary | ICD-10-CM | POA: Diagnosis present

## 2018-03-30 DIAGNOSIS — F0390 Unspecified dementia without behavioral disturbance: Secondary | ICD-10-CM | POA: Diagnosis present

## 2018-03-30 DIAGNOSIS — G92 Toxic encephalopathy: Secondary | ICD-10-CM | POA: Diagnosis present

## 2018-03-30 DIAGNOSIS — N390 Urinary tract infection, site not specified: Secondary | ICD-10-CM

## 2018-03-30 DIAGNOSIS — I5043 Acute on chronic combined systolic (congestive) and diastolic (congestive) heart failure: Secondary | ICD-10-CM | POA: Diagnosis present

## 2018-03-30 DIAGNOSIS — I130 Hypertensive heart and chronic kidney disease with heart failure and stage 1 through stage 4 chronic kidney disease, or unspecified chronic kidney disease: Secondary | ICD-10-CM | POA: Diagnosis present

## 2018-03-30 DIAGNOSIS — N182 Chronic kidney disease, stage 2 (mild): Secondary | ICD-10-CM | POA: Diagnosis present

## 2018-03-30 DIAGNOSIS — F0280 Dementia in other diseases classified elsewhere without behavioral disturbance: Secondary | ICD-10-CM | POA: Diagnosis not present

## 2018-03-30 DIAGNOSIS — D696 Thrombocytopenia, unspecified: Secondary | ICD-10-CM | POA: Diagnosis present

## 2018-03-30 DIAGNOSIS — N289 Disorder of kidney and ureter, unspecified: Secondary | ICD-10-CM | POA: Diagnosis not present

## 2018-03-30 DIAGNOSIS — Z7984 Long term (current) use of oral hypoglycemic drugs: Secondary | ICD-10-CM | POA: Diagnosis not present

## 2018-03-30 DIAGNOSIS — E876 Hypokalemia: Secondary | ICD-10-CM | POA: Diagnosis not present

## 2018-03-30 DIAGNOSIS — I081 Rheumatic disorders of both mitral and tricuspid valves: Secondary | ICD-10-CM | POA: Diagnosis present

## 2018-03-30 LAB — GLUCOSE, CAPILLARY
Glucose-Capillary: 90 mg/dL (ref 70–99)
Glucose-Capillary: 116 mg/dL — ABNORMAL HIGH (ref 70–99)
Glucose-Capillary: 168 mg/dL — ABNORMAL HIGH (ref 70–99)

## 2018-03-30 LAB — CBC
HCT: 41.2 % (ref 36.0–46.0)
Hemoglobin: 13 g/dL (ref 12.0–15.0)
MCH: 31.1 pg (ref 26.0–34.0)
MCHC: 31.6 g/dL (ref 30.0–36.0)
MCV: 98.6 fL (ref 80.0–100.0)
PLATELETS: 92 10*3/uL — AB (ref 150–400)
RBC: 4.18 MIL/uL (ref 3.87–5.11)
RDW: 18.5 % — AB (ref 11.5–15.5)
WBC: 4 10*3/uL (ref 4.0–10.5)
nRBC: 0 % (ref 0.0–0.2)

## 2018-03-30 LAB — BASIC METABOLIC PANEL
Anion gap: 14 (ref 5–15)
BUN: 25 mg/dL — ABNORMAL HIGH (ref 8–23)
CO2: 21 mmol/L — AB (ref 22–32)
Calcium: 8.5 mg/dL — ABNORMAL LOW (ref 8.9–10.3)
Chloride: 109 mmol/L (ref 98–111)
Creatinine, Ser: 1.51 mg/dL — ABNORMAL HIGH (ref 0.44–1.00)
GFR calc Af Amer: 39 mL/min — ABNORMAL LOW (ref >60)
GFR calc non Af Amer: 33 mL/min — ABNORMAL LOW (ref >60)
Glucose, Bld: 98 mg/dL (ref 70–99)
Potassium: 4.4 mmol/L (ref 3.5–5.1)
Sodium: 144 mmol/L (ref 135–145)

## 2018-03-30 LAB — UREA NITROGEN, URINE: Urea Nitrogen, Ur: 218 mg/dL

## 2018-03-30 LAB — ECHOCARDIOGRAM COMPLETE
Height: 62 in
WEIGHTICAEL: 2924.18 [oz_av]

## 2018-03-30 MED ORDER — FUROSEMIDE 10 MG/ML IJ SOLN
80.0000 mg | Freq: Two times a day (BID) | INTRAMUSCULAR | Status: DC
  Administered 2018-03-31 – 2018-04-01 (×3): 80 mg via INTRAVENOUS
  Filled 2018-03-30 (×3): qty 8

## 2018-03-30 NOTE — Consult Note (Signed)
WOC Nurse wound consult note Reason for Consult: left heel partial thickness intact bulla Wound type:pressure Pressure Injury POA: Yes Measurement:6cm x 4cm above skin level serous filled intact blister. Wound bed:not seen Drainage (amount, consistency, odor) none Periwound: dry calloused skin Dressing procedure/placement/frequency:I have provided nurses with orders for foam dressing to protect intact bulla and Prevalon Boots to prevent further injuries. Nutritional level poor, Nutritional Consult recommended for optimal wound healing, ( pt also has wound on sacrum)  please order if you agree. We will not follow, but will remain available to this patient, to nursing, and the medical and/or surgical teams.  Please re-consult if we need to assist further.  Barnett Hatter, RN-C, WTA-C, OCA Wound Treatment Associate Ostomy Care Associate

## 2018-03-30 NOTE — Progress Notes (Signed)
Ptt had 5 beats run of V-Tach, denies any chest pain or discomfort. Paged MD on call via Amion. Currently, pt is refusing blood draw, agitated and irritable, saying that she will "sue the staff for messing with her".

## 2018-03-30 NOTE — Consult Note (Addendum)
Cardiology Consultation:   Patient ID: Kelly Valencia MRN: [id number]; DOB: February 20, 1941  Admit date: 03/29/2018 Date of Consult: 03/30/2018  Primary Care Provider: System, Pcp Not In Primary Cardiologist: No primary care provider on file.  [place] Primary Electrophysiologist:  None    Patient Profile:   Kelly Valencia is a 77 y.o. female with a hx of CHF, hypertension, diabetes and dementia who is being seen today for the evaluation of CHF at the request of Dr. Blake Divine.  History of Present Illness:   Kelly Valencia is from [place], visiting her daughter in [place].  She was brought into the hospital on 03/29/2018 by her daughter due to increased confusion and lethargy.  She was found to have significant volume overload in the ED as well as a UTI.  Kelly Valencia was asleep upon my entering the room.  After I awakened her she does not want to provide any information.  It is unclear whether she actually is capable of providing meaningful information.  She says yes to most of my questions but is unable to give any specific responses.  She does not want to be examined.  With my limited exam I note trace-1+ lower extremity edema.  I have a call into her daughter for more information.   Her pharmacy in [place], [facility], called to review most recent meds filled.  Entresto 49/51 TID- Dr. Jaynie Bream  [phone]) Lisinopril 2.5 mg qd filled 02/11/18 #90 Carvedilol 3.125 Losartan 50 mg daily last filled 12/25/17 #90 (Dr. Chancy Milroy same phone) Lasix 40 mg daily filled 02/14/18 #90 Aricept 5 mg X 1 mo then increase to 10 mg -filled 9/28 #90 (potassium and atorvastatin 80 last filled in July)  [facility] with [facility] was on 03/03/18. Record requested and received:       Hx: Systolic CHF with EF 10-20%, hypertensive heart disease, long-term current use of insulin, acute ischemic heart disease, CKD stage III, edema, carotid artery disease, dyslipidemia.   No evidence of coronary artery disease.      Medication list reveals carvedilol 3.125 mg twice daily, Lasix 40 mg daily, potassium chloride 20 mEq daily, Entresto 49-51 mg 3 times daily   Past Medical History:  Diagnosis Date  . Dementia (HCC)   . Stroke [facility])     History reviewed. No pertinent surgical history.   Home Medications:  Prior to Admission medications   Medication Sig Start Date End Date Taking? Authorizing Provider  carvedilol (COREG) 3.125 MG tablet Take 3.125 mg by mouth 2 (two) times daily with a meal.   Yes [provider]  donepezil (ARICEPT) 5 MG tablet Take 5 mg by mouth at bedtime.   Yes [provider]  furosemide (LASIX) 40 MG tablet Take 40 mg by mouth daily.   Yes [provider]  lisinopril (PRINIVIL,ZESTRIL) 2.5 MG tablet Take 2.5 mg by mouth daily.   Yes [provider]  losartan (COZAAR) 50 MG tablet Take 50 mg by mouth daily.   Yes [provider]  metFORMIN (GLUCOPHAGE-XR) 500 MG 24 hr tablet Take 500 mg by mouth 2 (two) times daily.   Yes [provider]  sacubitril-valsartan (ENTRESTO) 49-51 MG Take 1 tablet by mouth 2 (two) times daily.   Yes [provider]    Inpatient Medications: Scheduled Meds: . carvedilol  3.125 mg Oral BID WC  . donepezil  5 mg Oral QHS  . furosemide  40 mg Intravenous Q12H  . insulin aspart  0-5 Units  Subcutaneous QHS  . insulin aspart  0-9 Units Subcutaneous TID WC  . sodium chloride flush  3 mL Intravenous Q12H   Continuous Infusions: . sodium chloride    . cefTRIAXone (ROCEPHIN)  IV 1 g (03/30/18 0436)   PRN Meds: sodium chloride, acetaminophen, ondansetron (ZOFRAN) IV, sodium chloride flush  Allergies:   No Known Allergies  Social History:   Social History   Socioeconomic History  . Marital status: Widowed    Spouse name: Not on file  . Number of children: Not on file  . Years of education: Not on file  . Highest education level: Not on file    Occupational History  . Not on file  Social Needs  . Financial resource strain: Not on file  . Food insecurity:    Worry: Not on file    Inability: Not on file  . Transportation needs:    Medical: Not on file    Non-medical: Not on file  Tobacco Use  . Smoking status: Never Smoker  . Smokeless tobacco: Never Used  Substance and Sexual Activity  . Alcohol use: Never    Frequency: Never  . Drug use: Never  . Sexual activity: Not on file  Lifestyle  . Physical activity:    Days per week: Not on file    Minutes per session: Not on file  . Stress: Not on file  Relationships  . Social connections:    Talks on phone: Not on file    Gets together: Not on file    Attends religious service: Not on file    Active member of club or organization: Not on file    Attends meetings of clubs or organizations: Not on file    Relationship status: Not on file  . Intimate partner violence:    Fear of current or ex partner: Not on file    Emotionally abused: Not on file    Physically abused: Not on file    Forced sexual activity: Not on file  Other Topics Concern  . Not on file  Social History Narrative  . Not on file    Family History:    Family History  Problem Relation Age of Onset  . Developmental delay Son      ROS:  Please see the history of present illness.  ROS limited due to disorientation.   Physical Exam/Data:   Vitals:   03/30/18 0005 03/30/18 0023 03/30/18 0500 03/30/18 0552  BP: 108/68   117/83  Pulse: 66 68  68  Resp:    18  Temp:  97.9 F (36.6 C)  (!) 97.5 F (36.4 C)  TempSrc:  Oral  Oral  SpO2:  100%  95%  Weight:   85.1 kg   Height:        Intake/Output Summary (Last 24 hours) at 03/30/2018 1144 Last data filed at 03/30/2018 1011 Gross per 24 hour  Intake 340 ml  Output 1450 ml  Net -1110 ml   Filed Weights   03/29/18 1401 03/30/18 0500  Weight: 82.9 kg 85.1 kg   Body mass index is 34.31 kg/m.  General:  Elderly female, in no acute  distress HEENT: normal Neck: no JVD Vascular: pedal pulses 2+ Cardiac:  normal S1, S2; RRR;  Lungs:  clear to auscultation bilaterally, no wheezing, rhonchi or rales  Abd: soft, nontender, no hepatomegaly  Ext: Trace-1+lower leg edema Musculoskeletal:  No deformities, BUE and BLE strength normal and equal Skin: warm and dry  Neuro:  Disoriented  and uncooperative Psych:  Irritable   EKG:  The EKG was personally reviewed and demonstrates: Sinus rhythm with LBBB, no previous for comparison Telemetry:  Telemetry was personally reviewed and demonstrates:  Sinus rhythm with PVCs, PACs in the 60's with rare 3-5 beats NSVT  Relevant CV Studies:  Echocardiogram 03/29/2018 Study Conclusions - Left ventricle: The cavity size was mildly dilated. There was   mild concentric hypertrophy. Systolic function was normal. The   estimated ejection fraction was in the range of 10% to 15%. Wall   motion was normal; there were no regional wall motion   abnormalities. Doppler parameters are consistent with abnormal   left ventricular relaxation (grade 1 diastolic dysfunction).   Doppler parameters are consistent with elevated ventricular   end-diastolic filling pressure. - Ventricular septum: Septal motion showed paradox. - Mitral valve: There was severe regurgitation. - Left atrium: The atrium was moderately dilated. - Right ventricle: The cavity size was severely dilated. Wall   thickness was normal. Systolic function was severely reduced. - Right atrium: The atrium was moderately dilated. - Tricuspid valve: There was severe regurgitation. - Pulmonary arteries: Systolic pressure was moderately increased.   PA peak pressure: 58 mm Hg (S). - Pericardium, extracardiac: There was a large left pleural   effusion.  Impressions: - Severe biventricular systolic dysfunction. Severe mitral   regurgitation (reversal of forward flow in pulmonary veins),   severe tricuspid regurgitation, moderate pulmonary  hypertension.   End stage CHF, consider advanced HF consult.   Laboratory Data:  Chemistry Recent Labs  Lab 03/29/18 0040 03/30/18 0643  NA 142 144  K 4.2 4.4  CL 107 109  CO2 20* 21*  GLUCOSE 143* 98  BUN 26* 25*  CREATININE 1.79* 1.51*  CALCIUM 8.9 8.5*  GFRNONAA 27* 33*  GFRAA 31* 39*  ANIONGAP 15 14    Recent Labs  Lab 03/29/18 0040  PROT 6.8  ALBUMIN 2.8*  AST 15  ALT 9  ALKPHOS 80  BILITOT 2.3*   Hematology Recent Labs  Lab 03/29/18 0040 03/30/18 0643  WBC 4.8 4.0  RBC 4.07 4.18  HGB 12.5 13.0  HCT 41.8 41.2  MCV 102.7* 98.6  MCH 30.7 31.1  MCHC 29.9* 31.6  RDW 18.8* 18.5*  PLT 95* 92*   Cardiac Enzymes Recent Labs  Lab 03/29/18 0040  TROPONINI <0.03   No results for input(s): TROPIPOC in the last 168 hours.  BNP Recent Labs  Lab 03/29/18 0040  BNP >4,500.0*    DDimer No results for input(s): DDIMER in the last 168 hours.  Radiology/Studies:  Ct Abdomen Pelvis Wo Contrast  Result Date: 03/29/2018 CLINICAL DATA:  Abdominal distension. EXAM: CT ABDOMEN AND PELVIS WITHOUT CONTRAST TECHNIQUE: Multidetector CT imaging of the abdomen and pelvis was performed following the standard protocol without IV contrast. COMPARISON:  None. FINDINGS: Lower chest: Moderate bilateral pleural effusions with compressive atelectasis in the lower lobes. Opacity in the lingula has the appearance of atelectasis. Multi chamber cardiomegaly with coronary artery calcifications. Hepatobiliary: No discrete focal hepatic lesion on noncontrast exam. High-density material in the region of the gallbladder fossa are presumed gallstones, however gallbladder is not well-defined. Pancreas: Streak artifact from overlying artifact obscures the pancreas. Spleen: Normal in size. Adrenals/Urinary Tract: Mild left adrenal thickening without nodule. The right adrenal gland is normal. Bilateral renal parenchymal thinning. No hydronephrosis. Streak artifact from external artifact partially  obscures the left kidney. Foley catheter decompresses the urinary bladder. Stomach/Bowel: Bowel evaluation is limited in the absence of contrast, streak  artifact, and patient motion artifact. No evidence of bowel obstruction or inflammatory change. Normal appendix. Small to moderate colonic stool burden. Vascular/Lymphatic: Aortic atherosclerosis. No aneurysm. Significantly limited assessment for adenopathy. Reproductive: Uterus is unremarkable.  No gross adnexal mass. Other: Marked diffuse body wall edema and edema throughout the abdominal mesentery. Probable small volume ascites. No free air. Musculoskeletal: T12 and L3 superior endplate compression fractures, likely chronic. Multilevel degenerative change in the spine. There are no acute or suspicious osseous abnormalities. IMPRESSION: 1. Marked body wall and soft tissue edema. Bilateral pleural effusions, at least moderate in degree. Trace ascites. Findings consistent with third spacing or fluid overload. 2. Probable gallstones, gallbladder is not well-defined. 3.  Aortic Atherosclerosis (ICD10-I70.0). Electronically Signed   By: Narda Rutherford M.D.   On: 03/29/2018 05:40   Dg Chest 2 View  Result Date: 03/29/2018 CLINICAL DATA:  Weakness. EXAM: CHEST - 2 VIEW COMPARISON:  None. FINDINGS: The heart is enlarged. There are small to moderate bilateral pleural effusions. Vascular congestion without overt pulmonary edema. No focal airspace disease. No pneumothorax. The bones are under mineralized. IMPRESSION: Cardiomegaly with small to moderate bilateral pleural effusions and vascular congestion. Findings consistent with fluid overload/CHF. Electronically Signed   By: Narda Rutherford M.D.   On: 03/29/2018 01:41   Ct Head Wo Contrast  Result Date: 03/29/2018 CLINICAL DATA:  Weakness and dizziness. Altered level of consciousness (LOC), unexplained; Polytrauma, critical, head/C-spine injury suspected EXAM: CT HEAD WITHOUT CONTRAST CT CERVICAL SPINE WITHOUT  CONTRAST TECHNIQUE: Multidetector CT imaging of the head and cervical spine was performed following the standard protocol without intravenous contrast. Multiplanar CT image reconstructions of the cervical spine were also generated. COMPARISON:  None. FINDINGS: CT HEAD FINDINGS Brain: Generalized atrophy. Moderate to advanced chronic small vessel ischemia. Remote lacunar infarct in the left cerebellum and basal ganglia. No intracranial hemorrhage, mass effect, or midline shift. No hydrocephalus. The basilar cisterns are patent. No evidence of territorial infarct or acute ischemia. No extra-axial or intracranial fluid collection. Vascular: Atherosclerosis of skullbase vasculature without hyperdense vessel or abnormal calcification. Skull: No fracture or suspicious lesion. Probable focal hyperostosis in the right frontal region. Sinuses/Orbits: Bilateral globe deformity, more prominent on the left with intra-ocular high-density material, calcifications versus surgical material. Mild mucosal thickening of the paranasal sinuses. The mastoid air cells are clear. Postsurgical change of the nose. Other: None. CT CERVICAL SPINE FINDINGS Alignment: No traumatic subluxation. Trace anterolisthesis of C4 on C5 appears degenerative. Skull base and vertebrae: No acute fracture. Vertebral body heights are maintained. The dens and skull base are intact. Soft tissues and spinal canal: No spinal canal hematoma. Fluid in the hypopharynx. Disc levels: Disc space narrowing and endplate spurring Z6-X0. Multilevel facet arthropathy. Upper chest: Large bilateral pleural effusions. Other: Carotid calcifications. IMPRESSION: 1. No acute intracranial abnormality. No skull fracture. 2. Generalized atrophy and chronic small vessel ischemia. 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 3. Degenerative change in the cervical spine without acute fracture or subluxation. 4. Bilateral pleural effusions, as seen on chest  radiograph earlier this day. Electronically Signed   By: Narda Rutherford M.D.   On: 03/29/2018 05:11   Ct Cervical Spine Wo Contrast  Result Date: 03/29/2018 CLINICAL DATA:  Weakness and dizziness. Altered level of consciousness (LOC), unexplained; Polytrauma, critical, head/C-spine injury suspected EXAM: CT HEAD WITHOUT CONTRAST CT CERVICAL SPINE WITHOUT CONTRAST TECHNIQUE: Multidetector CT imaging of the head and cervical spine was performed following the standard protocol without intravenous contrast. Multiplanar CT image  reconstructions of the cervical spine were also generated. COMPARISON:  None. FINDINGS: CT HEAD FINDINGS Brain: Generalized atrophy. Moderate to advanced chronic small vessel ischemia. Remote lacunar infarct in the left cerebellum and basal ganglia. No intracranial hemorrhage, mass effect, or midline shift. No hydrocephalus. The basilar cisterns are patent. No evidence of territorial infarct or acute ischemia. No extra-axial or intracranial fluid collection. Vascular: Atherosclerosis of skullbase vasculature without hyperdense vessel or abnormal calcification. Skull: No fracture or suspicious lesion. Probable focal hyperostosis in the right frontal region. Sinuses/Orbits: Bilateral globe deformity, more prominent on the left with intra-ocular high-density material, calcifications versus surgical material. Mild mucosal thickening of the paranasal sinuses. The mastoid air cells are clear. Postsurgical change of the nose. Other: None. CT CERVICAL SPINE FINDINGS Alignment: No traumatic subluxation. Trace anterolisthesis of C4 on C5 appears degenerative. Skull base and vertebrae: No acute fracture. Vertebral body heights are maintained. The dens and skull base are intact. Soft tissues and spinal canal: No spinal canal hematoma. Fluid in the hypopharynx. Disc levels: Disc space narrowing and endplate spurring U9-W1. Multilevel facet arthropathy. Upper chest: Large bilateral pleural effusions.  Other: Carotid calcifications. IMPRESSION: 1. No acute intracranial abnormality. No skull fracture. 2. Generalized atrophy and chronic small vessel ischemia. 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 3. Degenerative change in the cervical spine without acute fracture or subluxation. 4. Bilateral pleural effusions, as seen on chest radiograph earlier this day. Electronically Signed   By: Narda Rutherford M.D.   On: 03/29/2018 05:11    Assessment and Plan:   Acute on chronic combined systolic and diastolic heart failure -Patient is followed at [facility] in [facility].  Last office visit on 03/03/2018, note has been requested to be faxed to [place].  Known systolic heart failure with EF 10-20%, chronic lower extremity edema, no prior evidence of coronary artery disease. -Echocardiogram 03/29/2018 showed severe biventricular systolic dysfunction, EF 10-15%, no regional wall motion abnormalities, grade 1 diastolic dysfunction, severe mitral regurgitation (reversal of forward flow into pulmonary veins), severe tricuspid regurgitation, moderate pulmonary hypertension.  End-stage CHF. -BNP >4,500. Troponin negative.  -Chest x-ray with cardiomegaly and small to moderate bilateral pleural effusions and vascular congestion, consistent with volume overload/CHF.  Also CT of the abdomen showed bilateral pleural effusions at least moderate in degree, trace ascites and findings consistent with third spacing/fluid overload. -Currently diuresing with Lasix 40 mg IV every 12 hours with modest UOP, 850 mls documented for yesterday. Wt is actually up 2 kg ? Accuracy.  -She is in no distress at this time, resting comfortably and breathing without difficulty.  She does have trace-1+ lower extremity edema. -Continuing on low-dose carvedilol. ACE-I/ARB/ARNI are on hold due to renal function (home  Med list indicates ACE & ARB & Entresto- I have a call into the daughter to  review her history and actual med list.  [place] [facility] [place] that patient is on Entresto 49-51 mg 3 times daily) -Increase lasix to 80 mg IV BID for better diuresis  CKD -Unknown baseline.  Serum creatinine was 1.79 on admission, 1.51 today -Avoiding nephrotoxins  Hypertension -Home medications list carvedilol 3.125 mg twice daily, Lasix 40 mg daily, lisinopril, losartan and Entresto.  She is currently continued on carvedilol, Lasix. -Blood pressures well controlled  UTI -Treatment with IV antibiotic per primary team -May be contributing to her confusion and being uncooperative vs baseline (unknown)  Diabetes type 2 -Sliding scale insulin  while hospitalized.  Unknown level of control.      For questions or updates, please contact [facility] Please consult [email or web address] for contact info under     Signed, Berton Bon, NP  03/30/2018 11:44 AM

## 2018-03-30 NOTE — Progress Notes (Signed)
PROGRESS NOTE    Kelly Valencia  ACZ:[id number] DOB: November 28, 1940 DOA: 03/29/2018 PCP: System, Pcp Not In    Brief Narrative:   77 year old female originally from [place] but now visiting the daughter in town, history of CHF, hypertension, diabetes, dementia who was brought by the daughter to the hospital due to increased confusion and lethargy.  She was found to have significant fluid overload in the ED as well as a UTI for which she was admitted.   Assessment & Plan:   Principal Problem:   CHF, acute on chronic (HCC) Active Problems:   Dementia (HCC)   Essential hypertension   Urinary tract infection without hematuria   Thrombocytopenia (HCC)   Diabetes mellitus type II, non insulin dependent (HCC)   Renal insufficiency   Pressure injury of skin of left heel   Acute metabolic/toxic encephalopathy Probably from urinary tract infection and acute on chronic systolic heart failure She is confused and has dementia at baseline.  As per the daughter at bedside she appears to be at baseline.   Acute on chronic systolic heart failure Echocardiogram shows left ventricular ejection fraction around 10 to 15%.  She is on IV Lasix for diuresis.  Continue with daily weights, strict intake and output and monitor urine output.  Watch renal parameters and potassium while on IV Lasix.  Cardiology consulted and recommendations given.    Acute urinary tract infection Urine culture showing 100,000 gram-negative rods.  Further identification and sensitivities are pending.  Currently patient is on IV Rocephin, continue the same.   Pressure injury to left heel present on admission wound care consulted and recommendations given.    Chronic kidney disease unclear what stage.  We will continue to monitor.  Type 2 diabetes mellitus Continue with sliding scale insulin while in the hospital.   Dementia Continue with Aricept   Hypertension Continue with Coreg    DVT prophylaxis:  SCDs Code Status: Full code Family Communication: Discussed with daughter at bedside disposition Plan: Pending clinical improvement   Consultants:   Cardiology Dr. Duke Salvia  Procedures: Echocardiogram Antimicrobials: None  Subjective: He denies any chest pain or shortness of breath at this time  Objective: Vitals:   03/30/18 0023 03/30/18 0500 03/30/18 0552 03/30/18 1636  BP:   117/83 (!) 96/55  Pulse: 68  68   Resp:   18 18  Temp: 97.9 F (36.6 C)  (!) 97.5 F (36.4 C) (!) 97.3 F (36.3 C)  TempSrc: Oral  Oral   SpO2: 100%  95% 96%  Weight:  85.1 kg    Height:        Intake/Output Summary (Last 24 hours) at 03/30/2018 1757 Last data filed at 03/30/2018 1430 Gross per 24 hour  Intake 340 ml  Output 1450 ml  Net -1110 ml   Filed Weights   03/29/18 1401 03/30/18 0500  Weight: 82.9 kg 85.1 kg    Examination:  General exam: Calm, legally blind Respiratory system: Clear to auscultation. Respiratory effort normal. Cardiovascular system: S1 & S2 heard, RRR.  JVD present  gastrointestinal system: Abdomen is nondistended, soft and nontender. No organomegaly or masses felt. Normal bowel sounds heard. Central nervous system: Alert but confused. Extremities: Pedal edema present Skin: No rashes, lesions or ulcers Psychiatry:  Mood & affect appropriate.     Data Reviewed: I have personally reviewed following labs and imaging studies  CBC: Recent Labs  Lab 03/29/18 0040 03/30/18 0643  WBC 4.8 4.0  NEUTROABS 3.6  --   HGB 12.5 13.0  HCT 41.8 41.2  MCV 102.7* 98.6  PLT 95* 92*   Basic Metabolic Panel: Recent Labs  Lab 03/29/18 0040 03/30/18 0643  NA 142 144  K 4.2 4.4  CL 107 109  CO2 20* 21*  GLUCOSE 143* 98  BUN 26* 25*  CREATININE 1.79* 1.51*  CALCIUM 8.9 8.5*   GFR: Estimated Creatinine Clearance: 32.1 mL/min (A) (by C-G formula based on SCr of 1.51 mg/dL (H)). Liver Function Tests: Recent Labs  Lab 03/29/18 0040  AST 15  ALT 9  ALKPHOS  80  BILITOT 2.3*  PROT 6.8  ALBUMIN 2.8*   No results for input(s): LIPASE, AMYLASE in the last 168 hours. No results for input(s): AMMONIA in the last 168 hours. Coagulation Profile: No results for input(s): INR, PROTIME in the last 168 hours. Cardiac Enzymes: Recent Labs  Lab 03/29/18 0040  TROPONINI <0.03   BNP (last 3 results) No results for input(s): PROBNP in the last 8760 hours. HbA1C: No results for input(s): HGBA1C in the last 72 hours. CBG: Recent Labs  Lab 03/29/18 0745 03/29/18 1118 03/29/18 2120 03/30/18 0747 03/30/18 1632  GLUCAP 116* 128* 142* 90 116*   Lipid Profile: No results for input(s): CHOL, HDL, LDLCALC, TRIG, CHOLHDL, LDLDIRECT in the last 72 hours. Thyroid Function Tests: No results for input(s): TSH, T4TOTAL, FREET4, T3FREE, THYROIDAB in the last 72 hours. Anemia Panel: No results for input(s): VITAMINB12, FOLATE, FERRITIN, TIBC, IRON, RETICCTPCT in the last 72 hours. Sepsis Labs: No results for input(s): PROCALCITON, LATICACIDVEN in the last 168 hours.  Recent Results (from the past 240 hour(s))  Urine Culture     Status: Abnormal (Preliminary result)   Collection Time: 03/29/18  2:45 AM  Result Value Ref Range Status   Specimen Description URINE, CATHETERIZED  Final   Special Requests NONE  Final   Culture (A)  Final    >=100,000 COLONIES/mL GRAM NEGATIVE RODS CULTURE REINCUBATED FOR BETTER GROWTH Performed at [facility], [address], [place], [place] [address]    Report Status PENDING  Incomplete         Radiology Studies: Ct Abdomen Pelvis Wo Contrast  Result Date: 03/29/2018 CLINICAL DATA:  Abdominal distension. EXAM: CT ABDOMEN AND PELVIS WITHOUT CONTRAST TECHNIQUE: Multidetector CT imaging of the abdomen and pelvis was performed following the standard protocol without IV contrast. COMPARISON:  None. FINDINGS: Lower chest: Moderate bilateral pleural effusions with compressive atelectasis in the lower lobes. Opacity  in the lingula has the appearance of atelectasis. Multi chamber cardiomegaly with coronary artery calcifications. Hepatobiliary: No discrete focal hepatic lesion on noncontrast exam. High-density material in the region of the gallbladder fossa are presumed gallstones, however gallbladder is not well-defined. Pancreas: Streak artifact from overlying artifact obscures the pancreas. Spleen: Normal in size. Adrenals/Urinary Tract: Mild left adrenal thickening without nodule. The right adrenal gland is normal. Bilateral renal parenchymal thinning. No hydronephrosis. Streak artifact from external artifact partially obscures the left kidney. Foley catheter decompresses the urinary bladder. Stomach/Bowel: Bowel evaluation is limited in the absence of contrast, streak artifact, and patient motion artifact. No evidence of bowel obstruction or inflammatory change. Normal appendix. Small to moderate colonic stool burden. Vascular/Lymphatic: Aortic atherosclerosis. No aneurysm. Significantly limited assessment for adenopathy. Reproductive: Uterus is unremarkable.  No gross adnexal mass. Other: Marked diffuse body wall edema and edema throughout the abdominal mesentery. Probable small volume ascites. No free air. Musculoskeletal: T12 and L3 superior endplate compression fractures, likely chronic. Multilevel degenerative change in the spine. There are no  acute or suspicious osseous abnormalities. IMPRESSION: 1. Marked body wall and soft tissue edema. Bilateral pleural effusions, at least moderate in degree. Trace ascites. Findings consistent with third spacing or fluid overload. 2. Probable gallstones, gallbladder is not well-defined. 3.  Aortic Atherosclerosis (ICD10-I70.0). Electronically Signed   By: Narda Rutherford M.D.   On: 03/29/2018 05:40   Dg Chest 2 View  Result Date: 03/29/2018 CLINICAL DATA:  Weakness. EXAM: CHEST - 2 VIEW COMPARISON:  None. FINDINGS: The heart is enlarged. There are small to moderate bilateral  pleural effusions. Vascular congestion without overt pulmonary edema. No focal airspace disease. No pneumothorax. The bones are under mineralized. IMPRESSION: Cardiomegaly with small to moderate bilateral pleural effusions and vascular congestion. Findings consistent with fluid overload/CHF. Electronically Signed   By: Narda Rutherford M.D.   On: 03/29/2018 01:41   Ct Head Wo Contrast  Result Date: 03/29/2018 CLINICAL DATA:  Weakness and dizziness. Altered level of consciousness (LOC), unexplained; Polytrauma, critical, head/C-spine injury suspected EXAM: CT HEAD WITHOUT CONTRAST CT CERVICAL SPINE WITHOUT CONTRAST TECHNIQUE: Multidetector CT imaging of the head and cervical spine was performed following the standard protocol without intravenous contrast. Multiplanar CT image reconstructions of the cervical spine were also generated. COMPARISON:  None. FINDINGS: CT HEAD FINDINGS Brain: Generalized atrophy. Moderate to advanced chronic small vessel ischemia. Remote lacunar infarct in the left cerebellum and basal ganglia. No intracranial hemorrhage, mass effect, or midline shift. No hydrocephalus. The basilar cisterns are patent. No evidence of territorial infarct or acute ischemia. No extra-axial or intracranial fluid collection. Vascular: Atherosclerosis of skullbase vasculature without hyperdense vessel or abnormal calcification. Skull: No fracture or suspicious lesion. Probable focal hyperostosis in the right frontal region. Sinuses/Orbits: Bilateral globe deformity, more prominent on the left with intra-ocular high-density material, calcifications versus surgical material. Mild mucosal thickening of the paranasal sinuses. The mastoid air cells are clear. Postsurgical change of the nose. Other: None. CT CERVICAL SPINE FINDINGS Alignment: No traumatic subluxation. Trace anterolisthesis of C4 on C5 appears degenerative. Skull base and vertebrae: No acute fracture. Vertebral body heights are maintained. The dens  and skull base are intact. Soft tissues and spinal canal: No spinal canal hematoma. Fluid in the hypopharynx. Disc levels: Disc space narrowing and endplate spurring Z6-X0. Multilevel facet arthropathy. Upper chest: Large bilateral pleural effusions. Other: Carotid calcifications. IMPRESSION: 1. No acute intracranial abnormality. No skull fracture. 2. Generalized atrophy and chronic small vessel ischemia. 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 3. Degenerative change in the cervical spine without acute fracture or subluxation. 4. Bilateral pleural effusions, as seen on chest radiograph earlier this day. Electronically Signed   By: Narda Rutherford M.D.   On: 03/29/2018 05:11   Ct Cervical Spine Wo Contrast  Result Date: 03/29/2018 CLINICAL DATA:  Weakness and dizziness. Altered level of consciousness (LOC), unexplained; Polytrauma, critical, head/C-spine injury suspected EXAM: CT HEAD WITHOUT CONTRAST CT CERVICAL SPINE WITHOUT CONTRAST TECHNIQUE: Multidetector CT imaging of the head and cervical spine was performed following the standard protocol without intravenous contrast. Multiplanar CT image reconstructions of the cervical spine were also generated. COMPARISON:  None. FINDINGS: CT HEAD FINDINGS Brain: Generalized atrophy. Moderate to advanced chronic small vessel ischemia. Remote lacunar infarct in the left cerebellum and basal ganglia. No intracranial hemorrhage, mass effect, or midline shift. No hydrocephalus. The basilar cisterns are patent. No evidence of territorial infarct or acute ischemia. No extra-axial or intracranial fluid collection. Vascular: Atherosclerosis of skullbase vasculature without hyperdense vessel or abnormal calcification. Skull: No fracture  or suspicious lesion. Probable focal hyperostosis in the right frontal region. Sinuses/Orbits: Bilateral globe deformity, more prominent on the left with intra-ocular high-density material, calcifications versus  surgical material. Mild mucosal thickening of the paranasal sinuses. The mastoid air cells are clear. Postsurgical change of the nose. Other: None. CT CERVICAL SPINE FINDINGS Alignment: No traumatic subluxation. Trace anterolisthesis of C4 on C5 appears degenerative. Skull base and vertebrae: No acute fracture. Vertebral body heights are maintained. The dens and skull base are intact. Soft tissues and spinal canal: No spinal canal hematoma. Fluid in the hypopharynx. Disc levels: Disc space narrowing and endplate spurring Z6-X0. Multilevel facet arthropathy. Upper chest: Large bilateral pleural effusions. Other: Carotid calcifications. IMPRESSION: 1. No acute intracranial abnormality. No skull fracture. 2. Generalized atrophy and chronic small vessel ischemia. 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 3. Degenerative change in the cervical spine without acute fracture or subluxation. 4. Bilateral pleural effusions, as seen on chest radiograph earlier this day. Electronically Signed   By: Narda Rutherford M.D.   On: 03/29/2018 05:11        Scheduled Meds: . carvedilol  3.125 mg Oral BID WC  . donepezil  5 mg Oral QHS  . [START ON 03/31/2018] furosemide  80 mg Intravenous Q12H  . insulin aspart  0-5 Units Subcutaneous QHS  . insulin aspart  0-9 Units Subcutaneous TID WC  . sodium chloride flush  3 mL Intravenous Q12H   Continuous Infusions: . sodium chloride    . cefTRIAXone (ROCEPHIN)  IV 1 g (03/30/18 0436)     LOS: 0 days    Time spent 38 minutes    Kathlen Mody, MD [facility] Pager [phone] If 7PM-7AM, please contact night-coverage [email or web address] Password [facility] 03/30/2018, 5:57 PM

## 2018-03-30 NOTE — Plan of Care (Signed)
Pt alert but confused. Refused bedtime meds, yelling out  "call police, I just want to sleep". Daughter came by later and was able to talk her into taking her midnight medication. Per report, pt refused to eat at dinner time.

## 2018-03-31 DIAGNOSIS — N289 Disorder of kidney and ureter, unspecified: Secondary | ICD-10-CM

## 2018-03-31 DIAGNOSIS — I10 Essential (primary) hypertension: Secondary | ICD-10-CM

## 2018-03-31 LAB — BASIC METABOLIC PANEL
Anion gap: 12 (ref 5–15)
BUN: 22 mg/dL (ref 8–23)
CO2: 25 mmol/L (ref 22–32)
Calcium: 8.3 mg/dL — ABNORMAL LOW (ref 8.9–10.3)
Chloride: 106 mmol/L (ref 98–111)
Creatinine, Ser: 1.29 mg/dL — ABNORMAL HIGH (ref 0.44–1.00)
GFR calc Af Amer: 47 mL/min — ABNORMAL LOW (ref >60)
GFR calc non Af Amer: 40 mL/min — ABNORMAL LOW (ref >60)
Glucose, Bld: 188 mg/dL — ABNORMAL HIGH (ref 70–99)
Potassium: 3.1 mmol/L — ABNORMAL LOW (ref 3.5–5.1)
Sodium: 143 mmol/L (ref 135–145)

## 2018-03-31 LAB — GLUCOSE, CAPILLARY
GLUCOSE-CAPILLARY: 129 mg/dL — AB (ref 70–99)
Glucose-Capillary: 142 mg/dL — ABNORMAL HIGH (ref 70–99)
Glucose-Capillary: 157 mg/dL — ABNORMAL HIGH (ref 70–99)

## 2018-03-31 MED ORDER — POTASSIUM CHLORIDE CRYS ER 20 MEQ PO TBCR
40.0000 meq | EXTENDED_RELEASE_TABLET | Freq: Two times a day (BID) | ORAL | Status: AC
  Administered 2018-03-31 (×2): 40 meq via ORAL
  Filled 2018-03-31 (×2): qty 2

## 2018-03-31 NOTE — Progress Notes (Signed)
RN rounded on pt. Pt states she does not need anything at this time. 

## 2018-03-31 NOTE — Plan of Care (Signed)
  Problem: Activity: Goal: Capacity to carry out activities will improve Outcome: Progressing   Problem: Cardiac: Goal: Ability to achieve and maintain adequate cardiopulmonary perfusion will improve Outcome: Progressing   

## 2018-03-31 NOTE — Progress Notes (Signed)
PROGRESS NOTE    Kelly Valencia  ZOX:[id number] DOB: Aug 11, 1940 DOA: 03/29/2018 PCP: System, Pcp Not In    Brief Narrative:   77 year old female originally from [place] but now visiting the daughter in town, history of CHF, hypertension, diabetes, dementia who was brought by the daughter to the hospital due to increased confusion and lethargy.  She was found to have significant fluid overload in the ED as well as a UTI for which she was admitted.   Assessment & Plan:   Principal Problem:   CHF, acute on chronic (HCC) Active Problems:   Dementia (HCC)   Essential hypertension   Urinary tract infection without hematuria   Thrombocytopenia (HCC)   Diabetes mellitus type II, non insulin dependent (HCC)   Renal insufficiency   Pressure injury of skin of left heel   Acute metabolic/toxic encephalopathy Probably from urinary tract infection and acute on chronic systolic heart failure. She is confused and has dementia at baseline.  As per the daughter at bedside she appears to be at baseline. No agitation overnight.   Acute on chronic systolic heart failure Echocardiogram shows left ventricular ejection fraction around 10 to 15%.  She is on IV Lasix for diuresis.  Continue with daily weights, strict intake and output and monitor urine output.  Watch renal parameters and potassium while on IV Lasix.  Cardiology consulted and recommendations given. She has diuresed about 2 lit since admission.      Acute urinary tract infection Urine culture showing 100,000 gram-negative rods.  Further identification and sensitivities are pending.  Currently patient is on IV Rocephin, continue the same.   Pressure injury to left heel present on admission wound care consulted and recommendations given.    Chronic kidney disease unclear what stage.  Improving with diuresis.  We will continue to monitor.  Type 2 diabetes mellitus Continue with sliding scale insulin while in the  hospital. CBG (last 3)  Recent Labs    03/30/18 1632 03/30/18 2157 03/31/18 0748  GLUCAP 116* 168* 142*      Dementia Continue with Aricept   Hypertension Continue with Coreg  Hypokalemia Replaced repeat in am.     DVT prophylaxis: SCDs Code Status: Full code Family Communication: None  at bedside disposition Plan: Pending clinical improvement   Consultants:   Cardiology Dr. Duke Salviaandolph  Procedures: Echocardiogram Antimicrobials: None  Subjective:  She reports her breathing has improved when compared to yesterday. But reports feeling exhausted and weak.   Objective: Vitals:   03/30/18 1936 03/31/18 0012 03/31/18 0838 03/31/18 1200  BP: 95/62 122/76 120/70 (!) 125/58  Pulse: (!) 55 74 72 (!) 55  Resp: 20 18  19   Temp: 98.1 F (36.7 C) 98 F (36.7 C)  98 F (36.7 C)  TempSrc: Oral Oral  Oral  SpO2: 95% 97%  94%  Weight:  84.2 kg    Height:        Intake/Output Summary (Last 24 hours) at 03/31/2018 1535 Last data filed at 03/31/2018 1444 Gross per 24 hour  Intake 960 ml  Output 1775 ml  Net -815 ml   Filed Weights   03/29/18 1401 03/30/18 0500 03/31/18 0012  Weight: 82.9 kg 85.1 kg 84.2 kg    Examination:  General exam: Calm, legally blind, no tin any distress.  Respiratory system: diminished at bases, no wheezing heard, but tachypnea present.  Cardiovascular system: S1 & S2 heard, RRR.  JVD present  gastrointestinal system: Abdomen is soft NT ND BS+ Central nervous system: Alert but  confused. Extremities: Pedal edema present but improving.  Skin: No rashes, lesions or ulcers Psychiatry:  Mood & affect appropriate.     Data Reviewed: I have personally reviewed following labs and imaging studies  CBC: Recent Labs  Lab 03/29/18 0040 03/30/18 0643  WBC 4.8 4.0  NEUTROABS 3.6  --   HGB 12.5 13.0  HCT 41.8 41.2  MCV 102.7* 98.6  PLT 95* 92*   Basic Metabolic Panel: Recent Labs  Lab 03/29/18 0040 03/30/18 0643 03/31/18 0329  NA  142 144 143  K 4.2 4.4 3.1*  CL 107 109 106  CO2 20* 21* 25  GLUCOSE 143* 98 188*  BUN 26* 25* 22  CREATININE 1.79* 1.51* 1.29*  CALCIUM 8.9 8.5* 8.3*   GFR: Estimated Creatinine Clearance: 37.3 mL/min (A) (by C-G formula based on SCr of 1.29 mg/dL (H)). Liver Function Tests: Recent Labs  Lab 03/29/18 0040  AST 15  ALT 9  ALKPHOS 80  BILITOT 2.3*  PROT 6.8  ALBUMIN 2.8*   No results for input(s): LIPASE, AMYLASE in the last 168 hours. No results for input(s): AMMONIA in the last 168 hours. Coagulation Profile: No results for input(s): INR, PROTIME in the last 168 hours. Cardiac Enzymes: Recent Labs  Lab 03/29/18 0040  TROPONINI <0.03   BNP (last 3 results) No results for input(s): PROBNP in the last 8760 hours. HbA1C: No results for input(s): HGBA1C in the last 72 hours. CBG: Recent Labs  Lab 03/29/18 2120 03/30/18 0747 03/30/18 1632 03/30/18 2157 03/31/18 0748  GLUCAP 142* 90 116* 168* 142*   Lipid Profile: No results for input(s): CHOL, HDL, LDLCALC, TRIG, CHOLHDL, LDLDIRECT in the last 72 hours. Thyroid Function Tests: No results for input(s): TSH, T4TOTAL, FREET4, T3FREE, THYROIDAB in the last 72 hours. Anemia Panel: No results for input(s): VITAMINB12, FOLATE, FERRITIN, TIBC, IRON, RETICCTPCT in the last 72 hours. Sepsis Labs: No results for input(s): PROCALCITON, LATICACIDVEN in the last 168 hours.  Recent Results (from the past 240 hour(s))  Urine Culture     Status: Abnormal (Preliminary result)   Collection Time: 03/29/18  2:45 AM  Result Value Ref Range Status   Specimen Description URINE, CATHETERIZED  Final   Special Requests   Final    NONE Performed at [facility], [address], [place], [place] [address]    Culture (A)  Final    >=100,000 COLONIES/mL ESCHERICHIA COLI >=100,000 COLONIES/mL PROTEUS MIRABILIS    Report Status PENDING  Incomplete         Radiology Studies: No results found.      Scheduled Meds: .  carvedilol  3.125 mg Oral BID WC  . donepezil  5 mg Oral QHS  . furosemide  80 mg Intravenous Q12H  . insulin aspart  0-5 Units Subcutaneous QHS  . insulin aspart  0-9 Units Subcutaneous TID WC  . potassium chloride  40 mEq Oral BID  . sodium chloride flush  3 mL Intravenous Q12H   Continuous Infusions: . sodium chloride    . cefTRIAXone (ROCEPHIN)  IV 1 g (03/31/18 0414)     LOS: 1 day    Time spent 35 minutes    Kathlen Mody, MD [facility] Pager [phone] If 7PM-7AM, please contact night-coverage [email or web address] Password [email or web address] 03/31/2018, 3:35 PM

## 2018-03-31 NOTE — Progress Notes (Signed)
Received call from [place] with [facility] in [place], [place] ( [phone]); patient is active with them for [facility] services as prior to admission. Abelino Derrick [facility] [phone]

## 2018-03-31 NOTE — Plan of Care (Signed)
  Problem: Education: Goal: Ability to demonstrate management of disease process will improve Outcome: Progressing Goal: Ability to verbalize understanding of medication therapies will improve Outcome: Progressing   Problem: Activity: Goal: Capacity to carry out activities will improve Outcome: Progressing   Problem: Cardiac: Goal: Ability to achieve and maintain adequate cardiopulmonary perfusion will improve Outcome: Progressing   

## 2018-03-31 NOTE — Progress Notes (Signed)
Progress Note  Patient Name: Kelly Valencia Date of Encounter: 03/31/2018  Primary Cardiologist: No primary care provider on file.   Subjective   Feeling well.  Denies chest pain or shortness of breath.   Inpatient Medications    Scheduled Meds: . carvedilol  3.125 mg Oral BID WC  . donepezil  5 mg Oral QHS  . furosemide  80 mg Intravenous Q12H  . insulin aspart  0-5 Units Subcutaneous QHS  . insulin aspart  0-9 Units Subcutaneous TID WC  . potassium chloride  40 mEq Oral BID  . sodium chloride flush  3 mL Intravenous Q12H   Continuous Infusions: . sodium chloride    . cefTRIAXone (ROCEPHIN)  IV 1 g (03/31/18 0414)   PRN Meds: sodium chloride, acetaminophen, ondansetron (ZOFRAN) IV, sodium chloride flush   Vital Signs    Vitals:   03/30/18 1636 03/30/18 1936 03/31/18 0012 03/31/18 0838  BP: (!) 96/55 95/62 122/76 120/70  Pulse:  (!) 55 74 72  Resp: 18 20 18    Temp: (!) 97.3 F (36.3 C) 98.1 F (36.7 C) 98 F (36.7 C)   TempSrc:  Oral Oral   SpO2: 96% 95% 97%   Weight:   84.2 kg   Height:        Intake/Output Summary (Last 24 hours) at 03/31/2018 1039 Last data filed at 03/31/2018 0851 Gross per 24 hour  Intake 720 ml  Output 1300 ml  Net -580 ml   Filed Weights   03/29/18 1401 03/30/18 0500 03/31/18 0012  Weight: 82.9 kg 85.1 kg 84.2 kg    Telemetry    Sinus rhythm  Frequent PVCs.  NSVT up to 10 beats - Personally Reviewed  ECG    n/a - Personally Reviewed  Physical Exam   VS:  BP 120/70   Pulse 72   Temp 98 F (36.7 C) (Oral)   Resp 18   Ht 5\' 2"  (1.575 m)   Wt 84.2 kg   SpO2 97%   BMI 33.95 kg/m  , BMI Body mass index is 33.95 kg/m. GENERAL:  Chronically ill-appearing HEENT: Pupils equal round and reactive, fundi not visualized, oral mucosa unremarkable NECK:  + jugular venous distention 2 cm above clavicle at 45 degrees, waveform within normal limits, carotid upstroke brisk and symmetric, no bruits LUNGS:  Clear to auscultation  bilaterally HEART:  RRR.  PMI not displaced or sustained,S1 and S2 within normal limits, no S3, no S4, no clicks, no rubs, II/VI systolic murmur ABD:  Flat, positive bowel sounds normal in frequency in pitch, no bruits, no rebound, no guarding, no midline pulsatile mass, no hepatomegaly, no splenomegaly EXT:  2 plus pulses throughout, 1+ woody edema, no cyanosis no clubbing SKIN:  No rashes no nodules NEURO:  Confused, though less so than yesterday PSYCH:  Oriented to person   Labs    Chemistry Recent Labs  Lab 03/29/18 0040 03/30/18 0643 03/31/18 0329  NA 142 144 143  K 4.2 4.4 3.1*  CL 107 109 106  CO2 20* 21* 25  GLUCOSE 143* 98 188*  BUN 26* 25* 22  CREATININE 1.79* 1.51* 1.29*  CALCIUM 8.9 8.5* 8.3*  PROT 6.8  --   --   ALBUMIN 2.8*  --   --   AST 15  --   --   ALT 9  --   --   ALKPHOS 80  --   --   BILITOT 2.3*  --   --   GFRNONAA 27* 33*  40*  GFRAA 31* 39* 47*  ANIONGAP 15 14 12      Hematology Recent Labs  Lab 03/29/18 0040 03/30/18 0643  WBC 4.8 4.0  RBC 4.07 4.18  HGB 12.5 13.0  HCT 41.8 41.2  MCV 102.7* 98.6  MCH 30.7 31.1  MCHC 29.9* 31.6  RDW 18.8* 18.5*  PLT 95* 92*    Cardiac Enzymes Recent Labs  Lab 03/29/18 0040  TROPONINI <0.03   No results for input(s): TROPIPOC in the last 168 hours.   BNP Recent Labs  Lab 03/29/18 0040  BNP >4,500.0*     DDimer No results for input(s): DDIMER in the last 168 hours.   Radiology    No results found.  Cardiac Studies   Echo 03/29/18: Study Conclusions  - Left ventricle: The cavity size was mildly dilated. There was   mild concentric hypertrophy. Systolic function was normal. The   estimated ejection fraction was in the range of 10% to 15%. Wall   motion was normal; there were no regional wall motion   abnormalities. Doppler parameters are consistent with abnormal   left ventricular relaxation (grade 1 diastolic dysfunction).   Doppler parameters are consistent with elevated  ventricular   end-diastolic filling pressure. - Ventricular septum: Septal motion showed paradox. - Mitral valve: There was severe regurgitation. - Left atrium: The atrium was moderately dilated. - Right ventricle: The cavity size was severely dilated. Wall   thickness was normal. Systolic function was severely reduced. - Right atrium: The atrium was moderately dilated. - Tricuspid valve: There was severe regurgitation. - Pulmonary arteries: Systolic pressure was moderately increased.   PA peak pressure: 58 mm Hg (S). - Pericardium, extracardiac: There was a large left pleural   effusion.  Impressions:  - Severe biventricular systolic dysfunction. Severe mitral   regurgitation (reversal of forward flow in pulmonary veins),   severe tricuspid regurgitation, moderate pulmonary hypertension.   End stage CHF, consider advanced HF consult.  Patient Profile     77 y.o. female with chronic systolic and diastolic heart failure, hypertension, diabetes and dementia here with encephalopathy in the setting of UTI and acute on chronic heart failure.   Assessment & Plan    # Acute on chronic systolic and diastolic heart failure: Kelly Valencia is net - since yesterday.  Unclear if this is accurate.  Her JVD deems improved.  LVEF was 20% as an outpatient.  She reportedly has non-ischemic CM.  Lasix was recently increased to 40mg  bid for a week due to edema. Agree with increasing lasix to 80mg  IV bid.  She was on Entresto 49/51mg  as an outpatient.  Will likely start lower dose tomorrow if BP and renal function tolerate.      # UTI: Per primary team.    # Encephalopathy:  She doesn't seem encephalopathic anymore.  I suspect that she is now at her baseline of dementia.       For questions or updates, please contact [facility] Please consult [email or web address] for contact info under        Signed, Chilton Si, MD  03/31/2018, 10:39 AM

## 2018-04-01 LAB — URINE CULTURE: Culture: >=100000 — AB

## 2018-04-01 LAB — GLUCOSE, CAPILLARY
Glucose-Capillary: 141 mg/dL — ABNORMAL HIGH (ref 70–99)
Glucose-Capillary: 89 mg/dL (ref 70–99)
Glucose-Capillary: 88 mg/dL (ref 70–99)
Glucose-Capillary: 84 mg/dL (ref 70–99)
Glucose-Capillary: 156 mg/dL — ABNORMAL HIGH (ref 70–99)

## 2018-04-01 LAB — BASIC METABOLIC PANEL
Anion gap: 12 (ref 5–15)
BUN: 20 mg/dL (ref 8–23)
CO2: 28 mmol/L (ref 22–32)
Calcium: 8.5 mg/dL — ABNORMAL LOW (ref 8.9–10.3)
Chloride: 106 mmol/L (ref 98–111)
Creatinine, Ser: 1.27 mg/dL — ABNORMAL HIGH (ref 0.44–1.00)
GFR calc Af Amer: 47 mL/min — ABNORMAL LOW (ref >60)
GFR, EST NON AFRICAN AMERICAN: 41 mL/min — AB (ref >60)
Glucose, Bld: 102 mg/dL — ABNORMAL HIGH (ref 70–99)
POTASSIUM: 3.8 mmol/L (ref 3.5–5.1)
Sodium: 146 mmol/L — ABNORMAL HIGH (ref 135–145)

## 2018-04-01 MED ORDER — CEPHALEXIN 250 MG PO CAPS
500.0000 mg | ORAL_CAPSULE | Freq: Two times a day (BID) | ORAL | Status: DC
  Administered 2018-04-01 – 2018-04-05 (×9): 500 mg via ORAL
  Filled 2018-04-01 (×9): qty 2

## 2018-04-01 MED ORDER — FUROSEMIDE 40 MG PO TABS
40.0000 mg | ORAL_TABLET | Freq: Two times a day (BID) | ORAL | Status: DC
  Administered 2018-04-01 – 2018-04-02 (×3): 40 mg via ORAL
  Filled 2018-04-01 (×3): qty 1

## 2018-04-01 NOTE — Evaluation (Signed)
Physical Therapy Evaluation Patient Details Name: Kelly Valencia MRN: [id number] DOB: 1940-11-18 Today's Date: 04/01/2018   History of Present Illness  Pt is a 77 y.o. female admitted for acute on chronic CHF exacerbation and AMS from UTI. CT showed pleural effusions, trace ascites. PMH includes dementia, CVA, DM, HTN, legally blind  Clinical Impression  Pt admitted with above diagnosis. Pt presents with cardiovascular impairments, generalized weakness and baseline cognitive impairments that are limiting safe and independent bed mobility, transfers and amb. Pt required total A+2 for bed mobility. Pt did two sit to stands with RW and max A +2 for physical assistance. No family present to provide PLOF or home environment information. If daughter is able to provide level of support necessary, is able to go home with [facility]. If daughter is unable, pt will benefit from skilled PT to increase her independence and safety with mobility to allow discharge to SNF.       Follow Up Recommendations SNF;Supervision for mobility/OOB    Equipment Recommendations  Rolling walker with 5" wheels    Recommendations for Other Services       Precautions / Restrictions Precautions Precautions: Fall Restrictions Weight Bearing Restrictions: No      Mobility  Bed Mobility Overal bed mobility: Needs Assistance Bed Mobility: Supine to Sit;Sit to Supine     Supine to sit: Total assist;+2 for physical assistance Sit to supine: Total assist;+2 for physical assistance   General bed mobility comments: Total A +2 for progressing legs, hips & trunk elevation  Transfers Overall transfer level: Needs assistance Equipment used: Rolling walker (2 wheeled) Transfers: Sit to/from Stand Sit to Stand: +2 physical assistance;Max assist         General transfer comment: 2 sit to stand, max A +2 with RW. Assist for initiation and stability, needed to use feet to brake RW as patient pulling up on  it.  Ambulation/Gait Ambulation/Gait assistance: Max assist;+2 physical assistance Gait Distance (Feet): 2 Feet Assistive device: Rolling walker (2 wheeled) Gait Pattern/deviations: Leaning posteriorly;Trunk flexed     General Gait Details: 2 side steps  Stairs            Wheelchair Mobility    Modified Rankin (Stroke Patients Only)       Balance Overall balance assessment: Needs assistance Sitting-balance support: Bilateral upper extremity supported;Feet supported Sitting balance-Leahy Scale: Poor     Standing balance support: Bilateral upper extremity supported;During functional activity Standing balance-Leahy Scale: Zero                               Pertinent Vitals/Pain Pain Assessment: Faces Faces Pain Scale: No hurt    Home Living Family/patient expects to be discharged to:: Private residence   Available Help at Discharge: Family(daughter)           Home Equipment: Other (comment)(unknown, no family present) Additional Comments: Pt has baseline dementia, unable to collect reliable information since no family present    Prior Function           Comments: No family present to establish,      Hand Dominance        Extremity/Trunk Assessment   Upper Extremity Assessment Upper Extremity Assessment: Generalized weakness    Lower Extremity Assessment Lower Extremity Assessment: Generalized weakness       Communication   Communication: No difficulties  Cognition Arousal/Alertness: Awake/alert Behavior During Therapy: WFL for tasks assessed/performed Overall Cognitive Status: Impaired/Different from baseline  Area of Impairment: Orientation;Memory;Following commands;Awareness;Attention;Problem solving                 Orientation Level: Disoriented to;Place;Situation Current Attention Level: Selective Memory: Decreased short-term memory Following Commands: Follows one step commands with increased time;Follows one  step commands inconsistently     Problem Solving: Decreased initiation;Requires verbal cues;Requires tactile cues General Comments: Pt presented under sheets, introduced PTs and talked with patient and she slowly came out of covers. Pleasant. Oriented to self, upcoming holiday/month but not year or place. Knows she is sick after being told she is in hospital, not aware why.       General Comments      Exercises     Assessment/Plan    PT Assessment Patient needs continued PT services  PT Problem List Decreased strength;Decreased mobility;Decreased safety awareness;Decreased cognition;Decreased activity tolerance;Decreased balance;Decreased knowledge of use of DME;Cardiopulmonary status limiting activity       PT Treatment Interventions Gait training;Therapeutic exercise;Patient/family education;Cognitive remediation;Therapeutic activities;DME instruction;Stair training;Balance training;Functional mobility training;Neuromuscular re-education    PT Goals (Current goals can be found in the Care Plan section)  Acute Rehab PT Goals Patient Stated Goal: get tea PT Goal Formulation: With patient Time For Goal Achievement: 04/15/18 Potential to Achieve Goals: Good    Frequency Min 3X/week   Barriers to discharge        Co-evaluation               AM-PAC PT "6 Clicks" Mobility  Outcome Measure Help needed turning from your back to your side while in a flat bed without using bedrails?: Total Help needed moving from lying on your back to sitting on the side of a flat bed without using bedrails?: Total Help needed moving to and from a bed to a chair (including a wheelchair)?: Total Help needed standing up from a chair using your arms (e.g., wheelchair or bedside chair)?: A Lot Help needed to walk in hospital room?: Total Help needed climbing 3-5 steps with a railing? : Total 6 Click Score: 7    End of Session   Activity Tolerance: Patient tolerated treatment well Patient  left: with bed alarm set;in bed;with call bell/phone within reach Nurse Communication: Mobility status      Time: [id number] PT Time Calculation (min) (ACUTE ONLY): 22 min   Charges:   PT Evaluation $PT Eval Moderate Complexity: 1 Mod          Pattricia Bossnne Nicol Herbig, SPT Acute Rehab Services [phone]   Pattricia Bossnne Ennio Houp 04/01/2018, 4:02 PM

## 2018-04-01 NOTE — Progress Notes (Signed)
Progress Note  Patient Name: Jaquita RectorCarrie Villalva Date of Encounter: 04/01/2018  Primary Cardiologist: No primary care provider on file.   Subjective   Denies chest pain or shortness of breath.  Will not come out from under her blanket.   Inpatient Medications    Scheduled Meds: . carvedilol  3.125 mg Oral BID WC  . donepezil  5 mg Oral QHS  . furosemide  80 mg Intravenous Q12H  . insulin aspart  0-5 Units Subcutaneous QHS  . insulin aspart  0-9 Units Subcutaneous TID WC  . sodium chloride flush  3 mL Intravenous Q12H   Continuous Infusions: . sodium chloride    . cefTRIAXone (ROCEPHIN)  IV 1 g (04/01/18 0425)   PRN Meds: sodium chloride, acetaminophen, ondansetron (ZOFRAN) IV, sodium chloride flush   Vital Signs    Vitals:   03/31/18 1200 03/31/18 1656 03/31/18 1926 04/01/18 0443  BP: (!) 125/58 124/62 110/69 115/83  Pulse: (!) 55  74 79  Resp: 19  18 18   Temp: 98 F (36.7 C)  97.7 F (36.5 C) (!) 97.3 F (36.3 C)  TempSrc: Oral  Oral Oral  SpO2: 94%  100% 100%  Weight:    83 kg  Height:        Intake/Output Summary (Last 24 hours) at 04/01/2018 1020 Last data filed at 04/01/2018 0600 Gross per 24 hour  Intake 478 ml  Output 1600 ml  Net -1122 ml   Filed Weights   03/30/18 0500 03/31/18 0012 04/01/18 0443  Weight: 85.1 kg 84.2 kg 83 kg    Telemetry    Sinus rhythm  PVCs - Personally Reviewed  ECG    n/a - Personally Reviewed  Physical Exam   VS:  BP 115/83 (BP Location: Left Arm)   Pulse 79   Temp (!) 97.3 F (36.3 C) (Oral)   Resp 18   Ht 5\' 2"  (1.575 m)   Wt 83 kg   SpO2 100%   BMI 33.47 kg/m  , BMI Body mass index is 33.47 kg/m. GENERAL:  Chronically ill-appearing- unable to see patient.  She will not come out from under her blanket. EXT:  2 plus pulses throughout, 1+ woody edema worse R than L, no cyanosis no clubbing SKIN:  No rashes no nodules NEURO:  Confused.  More combative today. PSYCH:  Oriented to person   Labs      Chemistry Recent Labs  Lab 03/29/18 0040 03/30/18 0643 03/31/18 0329  NA 142 144 143  K 4.2 4.4 3.1*  CL 107 109 106  CO2 20* 21* 25  GLUCOSE 143* 98 188*  BUN 26* 25* 22  CREATININE 1.79* 1.51* 1.29*  CALCIUM 8.9 8.5* 8.3*  PROT 6.8  --   --   ALBUMIN 2.8*  --   --   AST 15  --   --   ALT 9  --   --   ALKPHOS 80  --   --   BILITOT 2.3*  --   --   GFRNONAA 27* 33* 40*  GFRAA 31* 39* 47*  ANIONGAP 15 14 12      Hematology Recent Labs  Lab 03/29/18 0040 03/30/18 0643  WBC 4.8 4.0  RBC 4.07 4.18  HGB 12.5 13.0  HCT 41.8 41.2  MCV 102.7* 98.6  MCH 30.7 31.1  MCHC 29.9* 31.6  RDW 18.8* 18.5*  PLT 95* 92*    Cardiac Enzymes Recent Labs  Lab 03/29/18 0040  TROPONINI <0.03   No results for  input(s): TROPIPOC in the last 168 hours.   BNP Recent Labs  Lab 03/29/18 0040  BNP >4,500.0*     DDimer No results for input(s): DDIMER in the last 168 hours.   Radiology    No results found.  Cardiac Studies   Echo 03/29/18: Study Conclusions  - Left ventricle: The cavity size was mildly dilated. There was   mild concentric hypertrophy. Systolic function was normal. The   estimated ejection fraction was in the range of 10% to 15%. Wall   motion was normal; there were no regional wall motion   abnormalities. Doppler parameters are consistent with abnormal   left ventricular relaxation (grade 1 diastolic dysfunction).   Doppler parameters are consistent with elevated ventricular   end-diastolic filling pressure. - Ventricular septum: Septal motion showed paradox. - Mitral valve: There was severe regurgitation. - Left atrium: The atrium was moderately dilated. - Right ventricle: The cavity size was severely dilated. Wall   thickness was normal. Systolic function was severely reduced. - Right atrium: The atrium was moderately dilated. - Tricuspid valve: There was severe regurgitation. - Pulmonary arteries: Systolic pressure was moderately increased.   PA  peak pressure: 58 mm Hg (S). - Pericardium, extracardiac: There was a large left pleural   effusion.  Impressions:  - Severe biventricular systolic dysfunction. Severe mitral   regurgitation (reversal of forward flow in pulmonary veins),   severe tricuspid regurgitation, moderate pulmonary hypertension.   End stage CHF, consider advanced HF consult.  Patient Profile     77 y.o. female with chronic systolic and diastolic heart failure, hypertension, diabetes and dementia here with encephalopathy in the setting of UTI and acute on chronic heart failure.   Assessment & Plan    # Acute on chronic systolic and diastolic heart failure: Unable to perform full physical exam due to lack of patient cooperation.  Her LE edema is improving.  Ms. Townsell is net -1.5L in the last 24 hours.  She is not allowing blood draw.  Will switch lasix from IV to 40mg  po oral bid.  She takes 40mg  daily as an outpatient.  LVEF was 20% as an outpatient and is 10-15% here.  She reportedly has non-ischemic CM.  Lasix was recently increased to 40mg  bid for a week due to edema.  She was on Entresto 49/51mg  as an outpatient.  BP remains a little low and we don't know her renal function today.  Would likely start 24/26mg  based on her renal function results.  # UTI: Per primary team.    # Delirium: # Dementia:  More confused and combative today.       For questions or updates, please contact [facility] Please consult [email or web address] for contact info under        Signed, Chilton Si, MD  04/01/2018, 10:20 AM

## 2018-04-01 NOTE — Progress Notes (Signed)
phelbotomy in to draw lab and pt refused, attempted fingerstick but pt hands cool and unable to get enough blood, paged Dr Blake Divine to advise, pt has covers over her face and will not interact but to say " let me alone, Ill get up in a bit"

## 2018-04-01 NOTE — Care Management Important Message (Signed)
Important Message  Patient Details  Name: Kelly Valencia MRN: [id number] Date of Birth: [birth year]   Medicare Important Message Given:  Yes    Amillion [place] P Shahil Speegle 04/01/2018, 3:45 PM

## 2018-04-01 NOTE — Progress Notes (Signed)
PROGRESS NOTE    Kelly Valencia  JYN:[id number] DOB: [birth year] DOA: 03/29/2018 PCP: System, Pcp Not In    Brief Narrative:   77 year old female originally from [place] but now visiting the daughter in town, history of CHF, hypertension, diabetes, dementia who was brought by the daughter to the hospital due to increased confusion and lethargy.  She was found to have significant fluid overload in the ED as well as a UTI for which she was admitted.   Assessment & Plan:   Principal Problem:   CHF, acute on chronic (HCC) Active Problems:   Dementia (HCC)   Essential hypertension   Urinary tract infection without hematuria   Thrombocytopenia (HCC)   Diabetes mellitus type II, non insulin dependent (HCC)   Renal insufficiency   Pressure injury of skin of left heel   Acute metabolic/toxic encephalopathy Probably from urinary tract infection and acute on chronic systolic heart failure. She is confused and has dementia at baseline.  Patient today refused to get blood draw, refused to eat breakfast and would not allow [place] to examine her, but later on she consented to being examined.  Agitation noted  Acute on chronic systolic heart failure Echocardiogram shows left ventricular ejection fraction around 10 to 15%.  She is on IV Lasix 40 mg twice daily for diuresis.  Switch to oral by cardiology today.  Continue with daily weights, strict intake and output and monitor urine output.  All parameters and potassium within normal limits today.  Cardiology consulted and recommendations given.   Acute urinary tract infection Urine culture showing Klebsiella and E. coli sensitive to Keflex.  Change IV Rocephin to oral Keflex for possible discharge in the next 24 hours.     Pressure injury to left heel present on admission wound care consulted and recommendations given. Stage II sacral ulcer Wound care consulted and recommendations given.   Chronic kidney disease stage II Improving with  diuresis.  We will continue to monitor.  Type 2 diabetes mellitus Continue with sliding scale insulin while in the hospital. CBG (last 3)  Recent Labs    03/31/18 2101 04/01/18 0733 04/01/18 1257  GLUCAP 129* 89 88   No change in medications  Dementia Continue with Aricept   Hypertension Continue with Coreg  Hypokalemia Replaced, repeat level within normal limits   DVT prophylaxis: SCDs Code Status: Full code Family Communication: None  at bedside disposition Plan: Pending clinical improvement   Consultants:   Cardiology Dr. Duke Salvia  Procedures: Echocardiogram Antimicrobials: None  Subjective: She denies any new complaints at this time, she initially refused to be examined later on consent to examine her.  Objective: Vitals:   03/31/18 1656 03/31/18 1926 04/01/18 0443 04/01/18 1454  BP: 124/62 110/69 115/83 125/82  Pulse:  74 79 79  Resp:  18 18 16   Temp:  97.7 F (36.5 C) (!) 97.3 F (36.3 C) 98.3 F (36.8 C)  TempSrc:  Oral Oral Oral  SpO2:  100% 100% 99%  Weight:   83 kg   Height:        Intake/Output Summary (Last 24 hours) at 04/01/2018 1642 Last data filed at 04/01/2018 1500 Gross per 24 hour  Intake 478 ml  Output 1925 ml  Net -1447 ml   Filed Weights   03/30/18 0500 03/31/18 0012 04/01/18 0443  Weight: 85.1 kg 84.2 kg 83 kg    Examination:  General exam: Calm, legally blind,, no distress noted Respiratory system: Air entry fair bilateral. Cardiovascular system: S1 & S2 heard,  RRR.   gastrointestinal system: Abdomen is soft, nontender, nondistended with good bowel sounds Central nervous system: Alert but confused. Extremities: Improving pedal edema Skin: No rashes, lesions or ulcers Psychiatry:  Mood & affect appropriate.     Data Reviewed: I have personally reviewed following labs and imaging studies  CBC: Recent Labs  Lab 03/29/18 0040 03/30/18 0643  WBC 4.8 4.0  NEUTROABS 3.6  --   HGB 12.5 13.0  HCT 41.8 41.2    MCV 102.7* 98.6  PLT 95* 92*   Basic Metabolic Panel: Recent Labs  Lab 03/29/18 0040 03/30/18 0643 03/31/18 0329 04/01/18 1534  NA 142 144 143 146*  K 4.2 4.4 3.1* 3.8  CL 107 109 106 106  CO2 20* 21* 25 28  GLUCOSE 143* 98 188* 102*  BUN 26* 25* 22 20  CREATININE 1.79* 1.51* 1.29* 1.27*  CALCIUM 8.9 8.5* 8.3* 8.5*   GFR: Estimated Creatinine Clearance: 37.7 mL/min (A) (by C-G formula based on SCr of 1.27 mg/dL (H)). Liver Function Tests: Recent Labs  Lab 03/29/18 0040  AST 15  ALT 9  ALKPHOS 80  BILITOT 2.3*  PROT 6.8  ALBUMIN 2.8*   No results for input(s): LIPASE, AMYLASE in the last 168 hours. No results for input(s): AMMONIA in the last 168 hours. Coagulation Profile: No results for input(s): INR, PROTIME in the last 168 hours. Cardiac Enzymes: Recent Labs  Lab 03/29/18 0040  TROPONINI <0.03   BNP (last 3 results) No results for input(s): PROBNP in the last 8760 hours. HbA1C: No results for input(s): HGBA1C in the last 72 hours. CBG: Recent Labs  Lab 03/31/18 1129 03/31/18 1636 03/31/18 2101 04/01/18 0733 04/01/18 1257  GLUCAP 141* 157* 129* 89 88   Lipid Profile: No results for input(s): CHOL, HDL, LDLCALC, TRIG, CHOLHDL, LDLDIRECT in the last 72 hours. Thyroid Function Tests: No results for input(s): TSH, T4TOTAL, FREET4, T3FREE, THYROIDAB in the last 72 hours. Anemia Panel: No results for input(s): VITAMINB12, FOLATE, FERRITIN, TIBC, IRON, RETICCTPCT in the last 72 hours. Sepsis Labs: No results for input(s): PROCALCITON, LATICACIDVEN in the last 168 hours.  Recent Results (from the past 240 hour(s))  Urine Culture     Status: Abnormal   Collection Time: 03/29/18  2:45 AM  Result Value Ref Range Status   Specimen Description URINE, CATHETERIZED  Final   Special Requests   Final    NONE Performed at [facility], [address] [place], [place] [address]    Culture (A)  Final    >=100,000 COLONIES/mL ESCHERICHIA  COLI >=100,000 COLONIES/mL PROTEUS MIRABILIS    Report Status 04/01/2018 FINAL  Final   Organism ID, Bacteria ESCHERICHIA COLI (A)  Final   Organism ID, Bacteria PROTEUS MIRABILIS (A)  Final      Susceptibility   Escherichia coli - MIC*    AMPICILLIN 16 INTERMEDIATE Intermediate     CEFAZOLIN <=4 SENSITIVE Sensitive     CEFTRIAXONE <=1 SENSITIVE Sensitive     CIPROFLOXACIN <=0.25 SENSITIVE Sensitive     GENTAMICIN <=1 SENSITIVE Sensitive     IMIPENEM <=0.25 SENSITIVE Sensitive     NITROFURANTOIN <=16 SENSITIVE Sensitive     TRIMETH/SULFA <=20 SENSITIVE Sensitive     AMPICILLIN/SULBACTAM 4 SENSITIVE Sensitive     PIP/TAZO <=4 SENSITIVE Sensitive     Extended ESBL NEGATIVE Sensitive     * >=100,000 COLONIES/mL ESCHERICHIA COLI   Proteus mirabilis - MIC*    AMPICILLIN <=2 SENSITIVE Sensitive     CEFAZOLIN <=4  SENSITIVE Sensitive     CEFTRIAXONE <=1 SENSITIVE Sensitive     CIPROFLOXACIN <=0.25 SENSITIVE Sensitive     GENTAMICIN <=1 SENSITIVE Sensitive     IMIPENEM 2 SENSITIVE Sensitive     NITROFURANTOIN 128 RESISTANT Resistant     TRIMETH/SULFA <=20 SENSITIVE Sensitive     AMPICILLIN/SULBACTAM <=2 SENSITIVE Sensitive     PIP/TAZO <=4 SENSITIVE Sensitive     * >=100,000 COLONIES/mL PROTEUS MIRABILIS         Radiology Studies: No results found.      Scheduled Meds: . carvedilol  3.125 mg Oral BID WC  . cephALEXin  500 mg Oral Q12H  . donepezil  5 mg Oral QHS  . furosemide  40 mg Oral BID  . insulin aspart  0-5 Units Subcutaneous QHS  . insulin aspart  0-9 Units Subcutaneous TID WC  . sodium chloride flush  3 mL Intravenous Q12H   Continuous Infusions: . sodium chloride       LOS: 2 days    Time spent 30 minutes    Kathlen ModyVijaya Frida Wahlstrom, MD [facility] Pager [phone] If 7PM-7AM, please contact night-coverage [email or web address] Password [email or web address] 04/01/2018, 4:42 PM

## 2018-04-02 DIAGNOSIS — I5043 Acute on chronic combined systolic (congestive) and diastolic (congestive) heart failure: Secondary | ICD-10-CM

## 2018-04-02 LAB — GLUCOSE, CAPILLARY
Glucose-Capillary: 135 mg/dL — ABNORMAL HIGH (ref 70–99)
Glucose-Capillary: 158 mg/dL — ABNORMAL HIGH (ref 70–99)
Glucose-Capillary: 177 mg/dL — ABNORMAL HIGH (ref 70–99)
Glucose-Capillary: 139 mg/dL — ABNORMAL HIGH (ref 70–99)

## 2018-04-02 MED ORDER — SACUBITRIL-VALSARTAN 24-26 MG PO TABS
1.0000 | ORAL_TABLET | Freq: Two times a day (BID) | ORAL | Status: DC
  Administered 2018-04-02 – 2018-04-05 (×7): 1 via ORAL
  Filled 2018-04-02 (×7): qty 1

## 2018-04-02 NOTE — Plan of Care (Signed)
  Problem: Education: Goal: Ability to demonstrate management of disease process will improve Outcome: Progressing Goal: Ability to verbalize understanding of medication therapies will improve Outcome: Progressing   Problem: Activity: Goal: Capacity to carry out activities will improve Outcome: Progressing   Problem: Cardiac: Goal: Ability to achieve and maintain adequate cardiopulmonary perfusion will improve Outcome: Progressing   

## 2018-04-02 NOTE — Progress Notes (Signed)
PROGRESS NOTE    Kelly RectorCarrie Valencia  ZOX:[id number] DOB: 1941-03-24 DOA: 03/29/2018 PCP: System, Pcp Not In    Brief Narrative:   77 year old [place] but now visiting the daughter in town, history of CHF, hypertension, diabetes, dementia who was brought by the daughter to the hospital due to increased confusion and lethargy.  She was found to have significant fluid overload in the ED as well as a UTI for which she was admitted.   Assessment & Plan:   Principal Problem:   CHF, acute on chronic (HCC) Active Problems:   Dementia (HCC)   Essential hypertension   Urinary tract infection without hematuria   Thrombocytopenia (HCC)   Diabetes mellitus type II, non insulin dependent (HCC)   Renal insufficiency   Pressure injury of skin of left heel   Acute metabolic/toxic encephalopathy Probably from urinary tract infection and acute on chronic systolic heart failure.  Today she is more back to her baseline.  Family members at bedside.  Acute on chronic systolic heart failure Echocardiogram shows left ventricular ejection fraction around 10 to 15%.  She is on IV Lasix 40 mg twice daily for diuresis.  Switched to oral by cardiology .  Continue with daily weights, strict intake and output and monitor urine output.  potassium within normal limits today.  Cardiology consulted and recommendations given. Entresto started by cardiology today.  Recheck creatinine tomorrow.   Acute urinary tract infection Urine culture showing Klebsiella and E. coli sensitive to Keflex.  On Keflex to complete the course   Pressure injury to left heel present on admission wound care consulted and recommendations given. Stage II sacral ulcer Wound care consulted and recommendations given.   Chronic kidney disease stage II Improving with diuresis.  We will continue to monitor.  Creatinine stable on Lasix  Type 2 diabetes mellitus Continue with sliding scale insulin while in the  hospital. CBG (last 3)  Recent Labs    04/02/18 1232 04/02/18 1254 04/02/18 1632  GLUCAP 177* 158* 139*   No change in medications  Dementia Continue with Aricept   Hypertension Continue with Coreg  Hypokalemia Replaced, repeat level within normal limits   DVT prophylaxis: SCDs Code Status: Full code Family Communication: None  at bedside disposition Plan: Pending clinical improvement   Consultants:   Cardiology Dr. Duke Salviaandolph  Procedures: Echocardiogram Antimicrobials: None  Subjective: No new complaints today  Objective: Vitals:   04/02/18 0616 04/02/18 0619 04/02/18 0820 04/02/18 1257  BP: 124/90  110/88 112/69  Pulse: 78  78 81  Resp: 18   18  Temp: 97.9 F (36.6 C)  97.6 F (36.4 C) 98 F (36.7 C)  TempSrc: Oral  Oral Oral  SpO2: 92%   (!) 80%  Weight:  83.5 kg    Height:        Intake/Output Summary (Last 24 hours) at 04/02/2018 1719 Last data filed at 04/02/2018 1245 Gross per 24 hour  Intake 246 ml  Output 1250 ml  Net -1004 ml   Filed Weights   03/31/18 0012 04/01/18 0443 04/02/18 0619  Weight: 84.2 kg 83 kg 83.5 kg    Examination:  General exam: Calm, legally blind,, in good spirits Respiratory system: Air entry fair bilateral.  No wheezing or rhonchi Cardiovascular system: S1 & S2 heard, RRR.   gastrointestinal system: Abdomen is soft, nontender, nondistended, good bowel sounds Central nervous system: Alert oriented to person and place Extremities: Improving pedal edema Skin: No rashes, lesions or ulcers Psychiatry:  Mood &  affect appropriate.     Data Reviewed: I have personally reviewed following labs and imaging studies  CBC: Recent Labs  Lab 03/29/18 0040 03/30/18 0643  WBC 4.8 4.0  NEUTROABS 3.6  --   HGB 12.5 13.0  HCT 41.8 41.2  MCV 102.7* 98.6  PLT 95* 92*   Basic Metabolic Panel: Recent Labs  Lab 03/29/18 0040 03/30/18 0643 03/31/18 0329 04/01/18 1534  NA 142 144 143 146*  K 4.2 4.4 3.1* 3.8  CL  107 109 106 106  CO2 20* 21* 25 28  GLUCOSE 143* 98 188* 102*  BUN 26* 25* 22 20  CREATININE 1.79* 1.51* 1.29* 1.27*  CALCIUM 8.9 8.5* 8.3* 8.5*   GFR: Estimated Creatinine Clearance: 37.8 mL/min (A) (by C-G formula based on SCr of 1.27 mg/dL (H)). Liver Function Tests: Recent Labs  Lab 03/29/18 0040  AST 15  ALT 9  ALKPHOS 80  BILITOT 2.3*  PROT 6.8  ALBUMIN 2.8*   No results for input(s): LIPASE, AMYLASE in the last 168 hours. No results for input(s): AMMONIA in the last 168 hours. Coagulation Profile: No results for input(s): INR, PROTIME in the last 168 hours. Cardiac Enzymes: Recent Labs  Lab 03/29/18 0040  TROPONINI <0.03   BNP (last 3 results) No results for input(s): PROBNP in the last 8760 hours. HbA1C: No results for input(s): HGBA1C in the last 72 hours. CBG: Recent Labs  Lab 04/01/18 2114 04/02/18 0849 04/02/18 1232 04/02/18 1254 04/02/18 1632  GLUCAP 156* 135* 177* 158* 139*   Lipid Profile: No results for input(s): CHOL, HDL, LDLCALC, TRIG, CHOLHDL, LDLDIRECT in the last 72 hours. Thyroid Function Tests: No results for input(s): TSH, T4TOTAL, FREET4, T3FREE, THYROIDAB in the last 72 hours. Anemia Panel: No results for input(s): VITAMINB12, FOLATE, FERRITIN, TIBC, IRON, RETICCTPCT in the last 72 hours. Sepsis Labs: No results for input(s): PROCALCITON, LATICACIDVEN in the last 168 hours.  Recent Results (from the past 240 hour(s))  Urine Culture     Status: Abnormal   Collection Time: 03/29/18  2:45 AM  Result Value Ref Range Status   Specimen Description URINE, CATHETERIZED  Final   Special Requests   Final    NONE Performed at [facility], [address], [place], [place] [address]    Culture (A)  Final    >=100,000 COLONIES/mL ESCHERICHIA COLI >=100,000 COLONIES/mL PROTEUS MIRABILIS    Report Status 04/01/2018 FINAL  Final   Organism ID, Bacteria ESCHERICHIA COLI (A)  Final   Organism ID, Bacteria PROTEUS MIRABILIS (A)  Final        Susceptibility   Escherichia coli - MIC*    AMPICILLIN 16 INTERMEDIATE Intermediate     CEFAZOLIN <=4 SENSITIVE Sensitive     CEFTRIAXONE <=1 SENSITIVE Sensitive     CIPROFLOXACIN <=0.25 SENSITIVE Sensitive     GENTAMICIN <=1 SENSITIVE Sensitive     IMIPENEM <=0.25 SENSITIVE Sensitive     NITROFURANTOIN <=16 SENSITIVE Sensitive     TRIMETH/SULFA <=20 SENSITIVE Sensitive     AMPICILLIN/SULBACTAM 4 SENSITIVE Sensitive     PIP/TAZO <=4 SENSITIVE Sensitive     Extended ESBL NEGATIVE Sensitive     * >=100,000 COLONIES/mL ESCHERICHIA COLI   Proteus mirabilis - MIC*    AMPICILLIN <=2 SENSITIVE Sensitive     CEFAZOLIN <=4 SENSITIVE Sensitive     CEFTRIAXONE <=1 SENSITIVE Sensitive     CIPROFLOXACIN <=0.25 SENSITIVE Sensitive     GENTAMICIN <=1 SENSITIVE Sensitive     IMIPENEM 2 SENSITIVE Sensitive  NITROFURANTOIN 128 RESISTANT Resistant     TRIMETH/SULFA <=20 SENSITIVE Sensitive     AMPICILLIN/SULBACTAM <=2 SENSITIVE Sensitive     PIP/TAZO <=4 SENSITIVE Sensitive     * >=100,000 COLONIES/mL PROTEUS MIRABILIS         Radiology Studies: No results found.      Scheduled Meds: . carvedilol  3.125 mg Oral BID WC  . cephALEXin  500 mg Oral Q12H  . donepezil  5 mg Oral QHS  . furosemide  40 mg Oral BID  . insulin aspart  0-5 Units Subcutaneous QHS  . insulin aspart  0-9 Units Subcutaneous TID WC  . sacubitril-valsartan  1 tablet Oral BID  . sodium chloride flush  3 mL Intravenous Q12H   Continuous Infusions: . sodium chloride       LOS: 3 days    Time spent 29 minutes    Kathlen Mody, MD [facility] Pager [phone] If 7PM-7AM, please contact night-coverage [email or web address] Password [facility] 04/02/2018, 5:19 PM

## 2018-04-02 NOTE — Progress Notes (Signed)
Progress Note  Patient Name: Kelly Valencia Date of Encounter: 04/02/2018  Primary Cardiologist: No primary care provider on file.   Subjective   SOB improving but not resolved  Inpatient Medications    Scheduled Meds: . carvedilol  3.125 mg Oral BID WC  . cephALEXin  500 mg Oral Q12H  . donepezil  5 mg Oral QHS  . furosemide  40 mg Oral BID  . insulin aspart  0-5 Units Subcutaneous QHS  . insulin aspart  0-9 Units Subcutaneous TID WC  . sodium chloride flush  3 mL Intravenous Q12H   Continuous Infusions: . sodium chloride     PRN Meds: sodium chloride, acetaminophen, ondansetron (ZOFRAN) IV, sodium chloride flush   Vital Signs    Vitals:   04/01/18 1454 04/01/18 2113 04/02/18 0616 04/02/18 0619  BP: 125/82 107/63 124/90   Pulse: 79 72 78   Resp: 16 20 18    Temp: 98.3 F (36.8 C) 98.1 F (36.7 C) 97.9 F (36.6 C)   TempSrc: Oral Oral Oral   SpO2: 99% 98% 92%   Weight:    83.5 kg  Height:        Intake/Output Summary (Last 24 hours) at 04/02/2018 0819 Last data filed at 04/02/2018 0630 Gross per 24 hour  Intake 483 ml  Output 1750 ml  Net -1267 ml   Filed Weights   03/31/18 0012 04/01/18 0443 04/02/18 0619  Weight: 84.2 kg 83 kg 83.5 kg    Telemetry    SR and PACs- Personally Reviewed  ECG    na  Physical Exam   GEN: No acute distress.   Neck: elevated JVD Cardiac: RRR, 3/6 systolic murmur at apex Respiratory: Clear to auscultation bilaterally. GI: Soft, nontender, non-distended  MS: 1+ bilateral LE edema; No deformity. Neuro:  Nonfocal  Psych: Normal affect   Labs    Chemistry Recent Labs  Lab 03/29/18 0040 03/30/18 0643 03/31/18 0329 04/01/18 1534  NA 142 144 143 146*  K 4.2 4.4 3.1* 3.8  CL 107 109 106 106  CO2 20* 21* 25 28  GLUCOSE 143* 98 188* 102*  BUN 26* 25* 22 20  CREATININE 1.79* 1.51* 1.29* 1.27*  CALCIUM 8.9 8.5* 8.3* 8.5*  PROT 6.8  --   --   --   ALBUMIN 2.8*  --   --   --   AST 15  --   --   --   ALT 9   --   --   --   ALKPHOS 80  --   --   --   BILITOT 2.3*  --   --   --   GFRNONAA 27* 33* 40* 41*  GFRAA 31* 39* 47* 47*  ANIONGAP 15 14 12 12      Hematology Recent Labs  Lab 03/29/18 0040 03/30/18 0643  WBC 4.8 4.0  RBC 4.07 4.18  HGB 12.5 13.0  HCT 41.8 41.2  MCV 102.7* 98.6  MCH 30.7 31.1  MCHC 29.9* 31.6  RDW 18.8* 18.5*  PLT 95* 92*    Cardiac Enzymes Recent Labs  Lab 03/29/18 0040  TROPONINI <0.03   No results for input(s): TROPIPOC in the last 168 hours.   BNP Recent Labs  Lab 03/29/18 0040  BNP >4,500.0*     DDimer No results for input(s): DDIMER in the last 168 hours.   Radiology    No results found.  Cardiac Studies    Patient Profile     77 y.o. female with chronic  systolic and diastolic heart failure, hypertension, diabetes and dementia here with encephalopathy in the setting of UTI and acute on chronic heart failure.   Assessment & Plan    1. Acute on chronic systolic/diastolic HF - 03/2018 echo 10-15%, grade I diastolic dysfunction, severe MR, severe RV dysfunction, severe TR, PASP 58 - notes indicate prior LVEF 20%, history of NICM - hospital management limited by poor patient compliance with exam and blood draws - negative 1.3 L yesterday, neg 4L since admission. Downtrend in Cr with diuresis consistent with venous congestion and CHF. She has been on oral lasix because has often refused lab draws, not able to reliably monitor renal function - other medical therapy with coreg. Restart entresto 24/26mg  bid and follow bps, if too low change to just low dose ARB  - remains fluid overloaded, continue diuresis.       For questions or updates, please contact [facility] Please consult [email or web address] for contact info under        Signed, Dina RichBranch, Vonna Brabson, MD  04/02/2018, 8:19 AM

## 2018-04-03 DIAGNOSIS — I5023 Acute on chronic systolic (congestive) heart failure: Secondary | ICD-10-CM

## 2018-04-03 LAB — GLUCOSE, CAPILLARY
GLUCOSE-CAPILLARY: 137 mg/dL — AB (ref 70–99)
Glucose-Capillary: 110 mg/dL — ABNORMAL HIGH (ref 70–99)
Glucose-Capillary: 123 mg/dL — ABNORMAL HIGH (ref 70–99)
Glucose-Capillary: 153 mg/dL — ABNORMAL HIGH (ref 70–99)
Glucose-Capillary: 93 mg/dL (ref 70–99)

## 2018-04-03 LAB — BASIC METABOLIC PANEL
ANION GAP: 13 (ref 5–15)
BUN: 18 mg/dL (ref 8–23)
CO2: 26 mmol/L (ref 22–32)
Calcium: 8 mg/dL — ABNORMAL LOW (ref 8.9–10.3)
Chloride: 104 mmol/L (ref 98–111)
Creatinine, Ser: 1.16 mg/dL — ABNORMAL HIGH (ref 0.44–1.00)
GFR calc non Af Amer: 46 mL/min — ABNORMAL LOW (ref >60)
GFR, EST AFRICAN AMERICAN: 53 mL/min — AB (ref >60)
Glucose, Bld: 94 mg/dL (ref 70–99)
Potassium: 3.6 mmol/L (ref 3.5–5.1)
Sodium: 143 mmol/L (ref 135–145)

## 2018-04-03 MED ORDER — FUROSEMIDE 20 MG PO TABS
20.0000 mg | ORAL_TABLET | Freq: Every evening | ORAL | Status: DC
  Administered 2018-04-03 – 2018-04-04 (×2): 20 mg via ORAL
  Filled 2018-04-03 (×2): qty 1

## 2018-04-03 MED ORDER — FUROSEMIDE 40 MG PO TABS
40.0000 mg | ORAL_TABLET | Freq: Every day | ORAL | Status: DC
  Administered 2018-04-04 – 2018-04-05 (×2): 40 mg via ORAL
  Filled 2018-04-03 (×2): qty 1

## 2018-04-03 NOTE — Progress Notes (Signed)
PROGRESS NOTE    Kelly Valencia  LID:[id number] DOB: 07/27/1940 DOA: 03/29/2018 PCP: System, Pcp Not In    Brief Narrative:   77 year old female originally from [place] but now visiting the daughter in town, history of CHF, hypertension, diabetes, dementia who was brought by the daughter to the hospital due to increased confusion and lethargy.  She was found to have significant fluid overload in the ED as well as a UTI for which she was admitted.   Assessment & Plan:   Principal Problem:   CHF, acute on chronic (HCC) Active Problems:   Dementia (HCC)   Essential hypertension   Urinary tract infection without hematuria   Thrombocytopenia (HCC)   Diabetes mellitus type II, non insulin dependent (HCC)   Renal insufficiency   Pressure injury of skin of left heel   Acute metabolic/toxic encephalopathy Probably from urinary tract infection and acute on chronic systolic heart failure.  She appears to be back to baseline.  No agitation overnight or this morning.   Acute on chronic systolic heart failure Echocardiogram shows left ventricular ejection fraction around 10 to 15%.  She is on IV Lasix 40 mg twice daily for diuresis, switch to 40 mg of Lasix in the morning and 20 mg in the evening..  Continue with daily weights, strict intake and output and monitor urine output.  potassium within normal limits today.  Cardiology consulted and recommendations given. Entresto started by cardiology.  Creatinine appears to be better than baseline.   Acute urinary tract infection Urine culture showing Klebsiella and E. coli sensitive to Keflex.  On Keflex to complete the course   Pressure injury to left heel present on admission wound care consulted and recommendations given. Stage II sacral ulcer Wound care consulted and recommendations given.   Chronic kidney disease stage II Improving with diuresis.  We will continue to monitor.  Creatinine better than baseline  Type 2 diabetes  mellitus Continue with sliding scale insulin while in the hospital. CBG (last 3)  Recent Labs    04/03/18 0722 04/03/18 1205 04/03/18 1659  GLUCAP 93 137* 123*   No change in medications  Dementia Continue with Aricept   Hypertension Continue with Coreg  Hypokalemia Replaced, repeat level within normal limits   DVT prophylaxis: SCDs Code Status: Full code Family Communication: None  at bedside disposition Plan: SNF when bed is available   Consultants:   Cardiology Dr. Duke Salvia  Procedures: Echocardiogram Antimicrobials: None  Subjective: No new complaints today, no chest pain, shortness of breath nausea vomiting or abdominal pain.  Objective: Vitals:   04/03/18 0417 04/03/18 0959 04/03/18 1208 04/03/18 1715  BP: 109/71 108/71 103/68 94/60  Pulse: 69 79 79 78  Resp: 18 18 20    Temp: 98.6 F (37 C)  98 F (36.7 C)   TempSrc: Oral  Oral   SpO2: 100% 98% 100% 99%  Weight: 84.8 kg     Height:        Intake/Output Summary (Last 24 hours) at 04/03/2018 1740 Last data filed at 04/03/2018 1727 Gross per 24 hour  Intake 723 ml  Output 1515 ml  Net -792 ml   Filed Weights   04/01/18 0443 04/02/18 0619 04/03/18 0417  Weight: 83 kg 83.5 kg 84.8 kg    Examination:  General exam: Calm, legally blind,, in good spirits Respiratory system: Clear to auscultation bilaterally, no wheezing or rhonchi Cardiovascular system: S1 & S2 heard, RRR.   gastrointestinal system: Abdomen is soft, nontender, nondistended, good bowel sounds Central  nervous system: Alert oriented to person and place Extremities: Improving pedal edema Skin: No rashes, lesions or ulcers Psychiatry:  Mood & affect appropriate.     Data Reviewed: I have personally reviewed following labs and imaging studies  CBC: Recent Labs  Lab 03/29/18 0040 03/30/18 0643  WBC 4.8 4.0  NEUTROABS 3.6  --   HGB 12.5 13.0  HCT 41.8 41.2  MCV 102.7* 98.6  PLT 95* 92*   Basic Metabolic Panel: Recent  Labs  Lab 03/29/18 0040 03/30/18 0643 03/31/18 0329 04/01/18 1534 04/03/18 0917  NA 142 144 143 146* 143  K 4.2 4.4 3.1* 3.8 3.6  CL 107 109 106 106 104  CO2 20* 21* 25 28 26   GLUCOSE 143* 98 188* 102* 94  BUN 26* 25* 22 20 18   CREATININE 1.79* 1.51* 1.29* 1.27* 1.16*  CALCIUM 8.9 8.5* 8.3* 8.5* 8.0*   GFR: Estimated Creatinine Clearance: 41.7 mL/min (A) (by C-G formula based on SCr of 1.16 mg/dL (H)). Liver Function Tests: Recent Labs  Lab 03/29/18 0040  AST 15  ALT 9  ALKPHOS 80  BILITOT 2.3*  PROT 6.8  ALBUMIN 2.8*   No results for input(s): LIPASE, AMYLASE in the last 168 hours. No results for input(s): AMMONIA in the last 168 hours. Coagulation Profile: No results for input(s): INR, PROTIME in the last 168 hours. Cardiac Enzymes: Recent Labs  Lab 03/29/18 0040  TROPONINI <0.03   BNP (last 3 results) No results for input(s): PROBNP in the last 8760 hours. HbA1C: No results for input(s): HGBA1C in the last 72 hours. CBG: Recent Labs  Lab 04/02/18 1632 04/03/18 0050 04/03/18 0722 04/03/18 1205 04/03/18 1659  GLUCAP 139* 110* 93 137* 123*   Lipid Profile: No results for input(s): CHOL, HDL, LDLCALC, TRIG, CHOLHDL, LDLDIRECT in the last 72 hours. Thyroid Function Tests: No results for input(s): TSH, T4TOTAL, FREET4, T3FREE, THYROIDAB in the last 72 hours. Anemia Panel: No results for input(s): VITAMINB12, FOLATE, FERRITIN, TIBC, IRON, RETICCTPCT in the last 72 hours. Sepsis Labs: No results for input(s): PROCALCITON, LATICACIDVEN in the last 168 hours.  Recent Results (from the past 240 hour(s))  Urine Culture     Status: Abnormal   Collection Time: 03/29/18  2:45 AM  Result Value Ref Range Status   Specimen Description URINE, CATHETERIZED  Final   Special Requests   Final    NONE Performed at [facility], [address] [place], [place] [address]    Culture (A)  Final    >=100,000 COLONIES/mL ESCHERICHIA COLI >=100,000 COLONIES/mL  PROTEUS MIRABILIS    Report Status 04/01/2018 FINAL  Final   Organism ID, Bacteria ESCHERICHIA COLI (A)  Final   Organism ID, Bacteria PROTEUS MIRABILIS (A)  Final      Susceptibility   Escherichia coli - MIC*    AMPICILLIN 16 INTERMEDIATE Intermediate     CEFAZOLIN <=4 SENSITIVE Sensitive     CEFTRIAXONE <=1 SENSITIVE Sensitive     CIPROFLOXACIN <=0.25 SENSITIVE Sensitive     GENTAMICIN <=1 SENSITIVE Sensitive     IMIPENEM <=0.25 SENSITIVE Sensitive     NITROFURANTOIN <=16 SENSITIVE Sensitive     TRIMETH/SULFA <=20 SENSITIVE Sensitive     AMPICILLIN/SULBACTAM 4 SENSITIVE Sensitive     PIP/TAZO <=4 SENSITIVE Sensitive     Extended ESBL NEGATIVE Sensitive     * >=100,000 COLONIES/mL ESCHERICHIA COLI   Proteus mirabilis - MIC*    AMPICILLIN <=2 SENSITIVE Sensitive     CEFAZOLIN <=4 SENSITIVE Sensitive  CEFTRIAXONE <=1 SENSITIVE Sensitive     CIPROFLOXACIN <=0.25 SENSITIVE Sensitive     GENTAMICIN <=1 SENSITIVE Sensitive     IMIPENEM 2 SENSITIVE Sensitive     NITROFURANTOIN 128 RESISTANT Resistant     TRIMETH/SULFA <=20 SENSITIVE Sensitive     AMPICILLIN/SULBACTAM <=2 SENSITIVE Sensitive     PIP/TAZO <=4 SENSITIVE Sensitive     * >=100,000 COLONIES/mL PROTEUS MIRABILIS         Radiology Studies: No results found.      Scheduled Meds: . carvedilol  3.125 mg Oral BID WC  . cephALEXin  500 mg Oral Q12H  . donepezil  5 mg Oral QHS  . furosemide  20 mg Oral QPM  . [START ON 04/04/2018] furosemide  40 mg Oral Daily  . insulin aspart  0-5 Units Subcutaneous QHS  . insulin aspart  0-9 Units Subcutaneous TID WC  . sacubitril-valsartan  1 tablet Oral BID  . sodium chloride flush  3 mL Intravenous Q12H   Continuous Infusions: . sodium chloride       LOS: 4 days    Time spent 26 minutes    Kathlen Mody, MD [facility] Pager [phone] If 7PM-7AM, please contact night-coverage [email or web address] Password [facility] 04/03/2018, 5:40 PM

## 2018-04-03 NOTE — Plan of Care (Signed)
  Problem: Education: Goal: Ability to demonstrate management of disease process will improve Outcome: Progressing Goal: Ability to verbalize understanding of medication therapies will improve Outcome: Progressing   Problem: Activity: Goal: Capacity to carry out activities will improve Outcome: Progressing   Problem: Cardiac: Goal: Ability to achieve and maintain adequate cardiopulmonary perfusion will improve Outcome: Progressing   

## 2018-04-03 NOTE — Progress Notes (Signed)
Progress Note  Patient Name: Kelly Valencia Date of Encounter: 04/03/2018  Primary Cardiologist: Followed in [place]   Subjective   Denies any SOB  Inpatient Medications    Scheduled Meds: . carvedilol  3.125 mg Oral BID WC  . cephALEXin  500 mg Oral Q12H  . donepezil  5 mg Oral QHS  . furosemide  40 mg Oral BID  . insulin aspart  0-5 Units Subcutaneous QHS  . insulin aspart  0-9 Units Subcutaneous TID WC  . sacubitril-valsartan  1 tablet Oral BID  . sodium chloride flush  3 mL Intravenous Q12H   Continuous Infusions: . sodium chloride     PRN Meds: sodium chloride, acetaminophen, ondansetron (ZOFRAN) IV, sodium chloride flush   Vital Signs    Vitals:   04/02/18 1257 04/02/18 1723 04/02/18 1942 04/03/18 0417  BP: 112/69 (!) 111/58 (!) 101/57 109/71  Pulse: 81 80 78 69  Resp: 18  18 18   Temp: 98 F (36.7 C)  98.2 F (36.8 C) 98.6 F (37 C)  TempSrc: Oral  Oral Oral  SpO2: (!) 80%  100% 100%  Weight:    84.8 kg  Height:        Intake/Output Summary (Last 24 hours) at 04/03/2018 0801 Last data filed at 04/03/2018 0600 Gross per 24 hour  Intake 3 ml  Output 1415 ml  Net -1412 ml   Filed Weights   04/01/18 0443 04/02/18 0619 04/03/18 0417  Weight: 83 kg 83.5 kg 84.8 kg    Telemetry    SR - Personally Reviewed  ECG    na  Physical Exam   GEN: No acute distress.   Neck: No JVD Cardiac: RRR, no murmurs, rubs, or gallops.  Respiratory: Clear to auscultation bilaterally. GI: Soft, nontender, non-distended  MS: No edema; No deformity. Neuro:  Nonfocal  Psych: Normal affect   Labs    Chemistry Recent Labs  Lab 03/29/18 0040 03/30/18 0643 03/31/18 0329 04/01/18 1534  NA 142 144 143 146*  K 4.2 4.4 3.1* 3.8  CL 107 109 106 106  CO2 20* 21* 25 28  GLUCOSE 143* 98 188* 102*  BUN 26* 25* 22 20  CREATININE 1.79* 1.51* 1.29* 1.27*  CALCIUM 8.9 8.5* 8.3* 8.5*  PROT 6.8  --   --   --   ALBUMIN 2.8*  --   --   --   AST 15  --   --    --   ALT 9  --   --   --   ALKPHOS 80  --   --   --   BILITOT 2.3*  --   --   --   GFRNONAA 27* 33* 40* 41*  GFRAA 31* 39* 47* 47*  ANIONGAP 15 14 12 12      Hematology Recent Labs  Lab 03/29/18 0040 03/30/18 0643  WBC 4.8 4.0  RBC 4.07 4.18  HGB 12.5 13.0  HCT 41.8 41.2  MCV 102.7* 98.6  MCH 30.7 31.1  MCHC 29.9* 31.6  RDW 18.8* 18.5*  PLT 95* 92*    Cardiac Enzymes Recent Labs  Lab 03/29/18 0040  TROPONINI <0.03   No results for input(s): TROPIPOC in the last 168 hours.   BNP Recent Labs  Lab 03/29/18 0040  BNP >4,500.0*     DDimer No results for input(s): DDIMER in the last 168 hours.   Radiology    No results found.  Cardiac Studies    Patient Profile     77  y.o. female with chronic systolic and diastolic heart failure, hypertension, diabetes and dementia here with encephalopathy in the setting of UTI and acute on chronic heart failure.   Assessment & Plan    1. Acute on chronic systolic/diastolic HF - 03/2018 echo 10-15%, grade I diastolic dysfunction, severe MR, severe RV dysfunction, severe TR, PASP 58 - notes indicate prior LVEF 20%, history of NICM - hospital management limited by poor patient compliance with exam and blood draws - negative 1.4 L yesterday, neg 5.4L since admission. Downtrend in Cr with diuresis consistent with venous congestion and CHF. She has been on oral lasix because has often refused lab draws, not able to reliably monitor renal function - other medical therapy with coreg. Restarted entresto 24/26mg  bid yesterday. BP's reasonable, follow up bmet today.   - appears nearing euvolemia. Lowere lasix to 40mg  in AM and 20mg  in PM.    For questions or updates, please contact [facility] Please consult [email or web address] for contact info under        Signed, Dina RichBranch, Lonnie Reth, MD  04/03/2018, 8:01 AM

## 2018-04-04 LAB — GLUCOSE, CAPILLARY
GLUCOSE-CAPILLARY: 141 mg/dL — AB (ref 70–99)
Glucose-Capillary: 106 mg/dL — ABNORMAL HIGH (ref 70–99)
Glucose-Capillary: 122 mg/dL — ABNORMAL HIGH (ref 70–99)
Glucose-Capillary: 101 mg/dL — ABNORMAL HIGH (ref 70–99)
Glucose-Capillary: 52 mg/dL — ABNORMAL LOW (ref 70–99)

## 2018-04-04 LAB — BASIC METABOLIC PANEL
ANION GAP: 10 (ref 5–15)
BUN: 21 mg/dL (ref 8–23)
CO2: 28 mmol/L (ref 22–32)
Calcium: 8.1 mg/dL — ABNORMAL LOW (ref 8.9–10.3)
Chloride: 105 mmol/L (ref 98–111)
Creatinine, Ser: 1.18 mg/dL — ABNORMAL HIGH (ref 0.44–1.00)
GFR calc Af Amer: 52 mL/min — ABNORMAL LOW (ref >60)
GFR calc non Af Amer: 45 mL/min — ABNORMAL LOW (ref >60)
Glucose, Bld: 125 mg/dL — ABNORMAL HIGH (ref 70–99)
Potassium: 3.7 mmol/L (ref 3.5–5.1)
Sodium: 143 mmol/L (ref 135–145)

## 2018-04-04 NOTE — Progress Notes (Signed)
Physical Therapy Treatment Patient Details Name: Kelly Valencia MRN: [id number] DOB: 04-Jul-1940 Today's Date: 04/04/2018    History of Present Illness Pt is a 77 y.o. female admitted for acute on chronic CHF exacerbation and AMS from UTI. CT showed pleural effusions, trace ascites. PMH includes dementia, CVA, DM, HTN, legally blind    PT Comments    Patient progressing slowly towards PT goals. Pleasantly confused today but willing to participate in mobility. Tolerated standing trials x3 from EOB with Max A of 2 due to weakness and posterior lean. Able to side step along side bed with assist of 2 for balance/safety. Repeating questions throughout session. Pt may be close to cognitive baseline but not sure about physical baseline. Daughter will need to be able to care for pt if she decides to go home and at this time she is needing quite a lot of assist to stand. Continue to recommend SNF. Will follow.   Follow Up Recommendations  SNF;Supervision for mobility/OOB     Equipment Recommendations  Rolling walker with 5" wheels    Recommendations for Other Services       Precautions / Restrictions Precautions Precautions: Fall Restrictions Weight Bearing Restrictions: No    Mobility  Bed Mobility Overal bed mobility: Needs Assistance Bed Mobility: Supine to Sit     Supine to sit: Total assist;+2 for physical assistance     General bed mobility comments: Total A +2 for progressing legs, hips & trunk elevation, heavy right lateral lean.   Transfers Overall transfer level: Needs assistance Equipment used: Rolling walker (2 wheeled);2 person hand held assist Transfers: Sit to/from Stand Sit to Stand: +2 physical assistance;Max assist         General transfer comment: Max A to power to standing x3 from EOB with cues for anterior weight shift and momentum; heavy posterior lean and to the right. When able to get CoM over BoS only Min A needed for balance.    Ambulation/Gait Ambulation/Gait assistance: +2 physical assistance;Mod assist Gait Distance (Feet): 4 Feet Assistive device: Rolling walker (2 wheeled) Gait Pattern/deviations: Leaning posteriorly;Trunk flexed;Step-to pattern     General Gait Details: Able to take a few side steps along side bed with assist for RW navigation and for balance due to posterior lean and lean to the right.    Stairs             Wheelchair Mobility    Modified Rankin (Stroke Patients Only)       Balance Overall balance assessment: Needs assistance Sitting-balance support: Bilateral upper extremity supported;Feet supported Sitting balance-Leahy Scale: Poor Sitting balance - Comments: Requires BUE supported on RW to maintain upright otherwise LOB towards right.    Standing balance support: Bilateral upper extremity supported;During functional activity Standing balance-Leahy Scale: Zero Standing balance comment: Requies Mod-Min A for standing balance with right lateral lean. BUE supported on RW.                             Cognition Arousal/Alertness: Awake/alert Behavior During Therapy: WFL for tasks assessed/performed Overall Cognitive Status: History of cognitive impairments - at baseline Area of Impairment: Orientation;Memory;Following commands;Attention;Problem solving                 Orientation Level: Disoriented to;Place;Situation;Time   Memory: Decreased short-term memory Following Commands: Follows one step commands with increased time;Follows one step commands inconsistently     Problem Solving: Slow processing;Decreased initiation;Requires verbal cues;Requires tactile cues General Comments: Pleasantly  confused. Repeating self throughout session asking about lunch.       Exercises      General Comments        Pertinent Vitals/Pain Pain Assessment: Faces Faces Pain Scale: No hurt    Home Living                      Prior Function             PT Goals (current goals can now be found in the care plan section) Progress towards PT goals: Progressing toward goals    Frequency    Min 2X/week      PT Plan Current plan remains appropriate;Frequency needs to be updated    Co-evaluation              AM-PAC PT "6 Clicks" Mobility   Outcome Measure  Help needed turning from your back to your side while in a flat bed without using bedrails?: Total Help needed moving from lying on your back to sitting on the side of a flat bed without using bedrails?: Total Help needed moving to and from a bed to a chair (including a wheelchair)?: Total Help needed standing up from a chair using your arms (e.g., wheelchair or bedside chair)?: A Lot Help needed to walk in hospital room?: Total Help needed climbing 3-5 steps with a railing? : Total 6 Click Score: 7    End of Session Equipment Utilized During Treatment: Gait belt Activity Tolerance: Patient tolerated treatment well Patient left: in bed;with call bell/phone within reach;with bed alarm set Nurse Communication: Mobility status PT Visit Diagnosis: Muscle weakness (generalized) (M62.81);Difficulty in walking, not elsewhere classified (R26.2)     Time: 7989-2119 PT Time Calculation (min) (ACUTE ONLY): 19 min  Charges:  $Therapeutic Activity: 8-22 mins                     Kelly Valencia, PT, DPT Acute Rehabilitation Services Pager [phone] Office [phone]       Kelly Valencia 04/04/2018, 12:35 PM

## 2018-04-04 NOTE — Clinical Social Work Note (Signed)
No supports at bedside. Tried calling daughter. Phone went to voicemail after two rings and voicemail box is full. Will try again later.  Charlynn Court, CSW [phone]

## 2018-04-04 NOTE — Clinical Social Work Note (Signed)
Clinical Social Work Assessment  Patient Details  Name: Kelly Valencia MRN: [id number] Date of Birth: 1941/04/12  Date of referral:  04/04/18               Reason for consult:  Facility Placement, Discharge Planning                Permission sought to share information with:  Facility Medical sales representative, Family Supports Permission granted to share information::     Name::     Ival Bible, Randa Evens  Agency::  SNF's  Relationship::  Daughters  Contact Information:  Kimber Relic: [phone]: [phone]  Housing/Transportation Living arrangements for the past 2 months:  Single Family Home Source of Information:  Medical Team, Adult Children Patient Interpreter Needed:  None Criminal Activity/Legal Involvement Pertinent to Current Situation/Hospitalization:  No - Comment as needed Significant Relationships:  Adult Children Lives with:  Adult Children Do you feel safe going back to the place where you live?  Yes Need for family participation in patient care:  Yes (Comment)  Care giving concerns:  PT recommending SNF once medically stable for discharge.   Social Worker assessment / plan:  Patient only oriented to self. No supports at bedside. Called patient's daughter Kimber Relic, introduced role, and explained that PT recommendations would be discussed. Patient's daughter is agreeable to SNF but concerned due to difficulty they had getting patient out of a SNF in [place] a year ago. CSW ensured that when referral is sent out, CSW will note that plan is for no longer than 20 days and then return home to [place]. Mariama asked that CSW also call her sister, Randa Evens. Provided same information and Randa Evens is agreeable to SNF as well. Patient has been living with her for three years. Her husband passed away 10 years ago. Patient has no documented HCPOA. Will send out referral and follow up with daughters with bed offers. No further concerns. CSW encouraged patient's daughters to  contact CSW as needed. CSW will continue to follow patient and her daughters for support and facilitate discharge to SNF once bed available.  Employment status:  Retired Health and safety inspector:  Medicare PT Recommendations:  Skilled Nursing Facility Information / Referral to community resources:  Skilled Nursing Facility  Patient/Family's Response to care:  Patient not fully oriented. Patient's daughters agreeable to SNF placement. Patient's daughters supportive and involved in patient's care. Patient's daughters appreciated social work intervention.  Patient/Family's Understanding of and Emotional Response to Diagnosis, Current Treatment, and Prognosis:  Patient not fully oriented. Patient's daughters have a good understanding of the reason for admission and her need for rehab prior to returning home. Patient's daughters appear happy with hospital care.  Emotional Assessment Appearance:  Appears stated age Attitude/Demeanor/Rapport:  Unable to Assess Affect (typically observed):  Unable to Assess Orientation:  Oriented to Self Alcohol / Substance use:  Never Used Psych involvement (Current and /or in the community):  No (Comment)  Discharge Needs  Concerns to be addressed:  Care Coordination Readmission within the last 30 days:  No Current discharge risk:  Dependent with Mobility, Cognitively Impaired Barriers to Discharge:  No SNF bed   Margarito Liner, LCSW 04/04/2018, 3:32 PM

## 2018-04-04 NOTE — Plan of Care (Signed)
  Problem: Activity: Goal: Capacity to carry out activities will improve Outcome: Not Progressing  Pt. Requires assistance with ADLs

## 2018-04-04 NOTE — Progress Notes (Addendum)
Progress Note  Patient Name: Kelly Valencia Date of Encounter: 04/04/2018  Primary Cardiologist: [place].  Subjective   No significant overnight events. Patient very sleepy during exam but denies any chest pain or shortness of breath. She has no complaints at this time.   Inpatient Medications    Scheduled Meds: . carvedilol  3.125 mg Oral BID WC  . cephALEXin  500 mg Oral Q12H  . donepezil  5 mg Oral QHS  . furosemide  20 mg Oral QPM  . furosemide  40 mg Oral Daily  . insulin aspart  0-5 Units Subcutaneous QHS  . insulin aspart  0-9 Units Subcutaneous TID WC  . sacubitril-valsartan  1 tablet Oral BID  . sodium chloride flush  3 mL Intravenous Q12H   Continuous Infusions: . sodium chloride     PRN Meds: sodium chloride, acetaminophen, ondansetron (ZOFRAN) IV, sodium chloride flush   Vital Signs    Vitals:   04/03/18 1949 04/04/18 0508 04/04/18 0514 04/04/18 0517  BP: 108/61  109/73   Pulse: 79 92 73   Resp: 18 18 18    Temp: 98.3 F (36.8 C) 98.7 F (37.1 C) 98 F (36.7 C)   TempSrc: Oral Oral Oral   SpO2: 97% 100% 98%   Weight:    88.1 kg  Height:        Intake/Output Summary (Last 24 hours) at 04/04/2018 1016 Last data filed at 04/04/2018 0941 Gross per 24 hour  Intake 703 ml  Output 900 ml  Net -197 ml   Filed Weights   04/02/18 0619 04/03/18 0417 04/04/18 0517  Weight: 83.5 kg 84.8 kg 88.1 kg    Telemetry    Sinus rhythm with frequent PVC sometimes in trigeminy pattern and heart rate in the 70's to 90's. - Personally Reviewed  ECG    No new ECG tracings today. - Personally Reviewed  Physical Exam   GEN: 77 year old African-American female resting comfortably. No acute distress. Appears very drowsy on exam but would briefly answer questions. Neck: Supple. Unable to assess due to position of patient. Cardiac: RRR. No significant murmurs, rubs, or gallops.  Respiratory: Clear to auscultation anteriorly. GI: Abdomen  soft, non-tender, non-distended  MS: 1-2+ pitting edema of bilateral lower extremities up to knees. Neuro:  No focal deficits noted. Psych: Very drowsy.   Labs    Chemistry Recent Labs  Lab 03/29/18 0040  04/01/18 1534 04/03/18 0917 04/04/18 0604  NA 142   < > 146* 143 143  K 4.2   < > 3.8 3.6 3.7  CL 107   < > 106 104 105  CO2 20*   < > 28 26 28   GLUCOSE 143*   < > 102* 94 125*  BUN 26*   < > 20 18 21   CREATININE 1.79*   < > 1.27* 1.16* 1.18*  CALCIUM 8.9   < > 8.5* 8.0* 8.1*  PROT 6.8  --   --   --   --   ALBUMIN 2.8*  --   --   --   --   AST 15  --   --   --   --   ALT 9  --   --   --   --   ALKPHOS 80  --   --   --   --   BILITOT 2.3*  --   --   --   --   GFRNONAA 27*   < > 41*  46* 45*  GFRAA 31*   < > 47* 53* 52*  ANIONGAP 15   < > 12 13 10    < > = values in this interval not displayed.     Hematology Recent Labs  Lab 03/29/18 0040 03/30/18 0643  WBC 4.8 4.0  RBC 4.07 4.18  HGB 12.5 13.0  HCT 41.8 41.2  MCV 102.7* 98.6  MCH 30.7 31.1  MCHC 29.9* 31.6  RDW 18.8* 18.5*  PLT 95* 92*    Cardiac Enzymes Recent Labs  Lab 03/29/18 0040  TROPONINI <0.03   No results for input(s): TROPIPOC in the last 168 hours.   BNP Recent Labs  Lab 03/29/18 0040  BNP >4,500.0*     DDimer No results for input(s): DDIMER in the last 168 hours.   Radiology    No results found.  Cardiac Studies   Echocardiogram 03/29/2018: Study Conclusions: - Left ventricle: The cavity size was mildly dilated. There was   mild concentric hypertrophy. Systolic function was normal. The   estimated ejection fraction was in the range of 10% to 15%. Wall   motion was normal; there were no regional wall motion   abnormalities. Doppler parameters are consistent with abnormal   left ventricular relaxation (grade 1 diastolic dysfunction).   Doppler parameters are consistent with elevated ventricular   end-diastolic filling pressure. - Ventricular septum: Septal motion showed  paradox. - Mitral valve: There was severe regurgitation. - Left atrium: The atrium was moderately dilated. - Right ventricle: The cavity size was severely dilated. Wall   thickness was normal. Systolic function was severely reduced. - Right atrium: The atrium was moderately dilated. - Tricuspid valve: There was severe regurgitation. - Pulmonary arteries: Systolic pressure was moderately increased.   PA peak pressure: 58 mm Hg (S). - Pericardium, extracardiac: There was a large left pleural   effusion.  Impressions: - Severe biventricular systolic dysfunction. Severe mitral   regurgitation (reversal of forward flow in pulmonary veins),   severe tricuspid regurgitation, moderate pulmonary hypertension.   End stage CHF, consider advanced HF consult.  Patient Profile     Ms. Warman is a 77 year old female with a history of chronic systolic CHF, hypertension, diabetes, and dementia who presented to the [facility] ED on 03/29/2018 for increased confusion and lethargy. She was found to have significant volume overload in the ED as well as a UTI. Cardiology was consulted for evaluation of CHF.   Assessment & Plan    Acute on Chronic Systolic CHF - Echo on 03/29/2018 showed severely reduced biventricular systolic function with LVEF of 10-15%, no regional wall motion abnormalities, grade 1 diastolic dysfunction, severe MR, severe TR, and elevated PASP of 58 mmHg. - BNP > 4,500. [facility] management has been limited by poor patient compliance with exam and blood draws. Patient has been on oral Lasix because she has often refused lab draws and we were not able to reliably monitor renal function. Downtrend in serum creatinine with diuresis is consistent with venous congestion and CHF. Serum creatine stable at 1.18 today. - Documented output of 1.1 L in the past 24 hours and net negative 5.5 L since admission. - Weight up about 12 lbs since admission (? suspect this is not accurate). - Continue  diuresis with PO Lasix: 40mg  in the morning and 20mg  in the evening. - Continue Coreg 3.125mg  twice daily. - Continue Entresto 24-26mg  twice daily (restarted on 12/21). - Continue to monitor daily weights, strict I/O's, and renal function.  For  questions or updates, please contact [facility] Please consult [email or web address] for contact info under        Signed, Corrin Parker, PA-C  04/04/2018, 10:16 AM    Attending Note:   The patient was seen and examined.  Agree with assessment and plan as noted above.  Changes made to the above note as needed.  Patient seen and independently examined with Marjie Skiff, PA .   We discussed all aspects of the encounter. I agree with the assessment and plan as stated above.  1.  Acute on chronic systolic and diastolic congestive heart failure: The patient has a nonischemic cardiomyopathy.  Her ins and outs have been difficult to assess during this hospitalization. Has a foley ( chronic )  She is on Lasix 40 mg in the morning with 20 mg in the evening. He takes carvedilol 3.125 mg twice a day, Entresto 24-26 mg twice a day.. Continue current medications.    I have spent a total of 40 minutes with patient reviewing hospital  notes , telemetry, EKGs, labs and examining patient as well as establishing an assessment and plan that was discussed with the patient. > 50% of time was spent in direct patient care.    Vesta Mixer, Montez Hageman., MD, [facility] 04/04/2018, 12:49 PM [address],  [address] Office [phone] Pager [phone]

## 2018-04-04 NOTE — Plan of Care (Signed)
  Problem: Activity: Goal: Capacity to carry out activities will improve 04/04/2018 2154 by Carolanne Grumbling, RN Outcome: Not Progressing 04/04/2018 2144 by Carolanne Grumbling, RN Outcome: Not Progressing

## 2018-04-04 NOTE — Progress Notes (Signed)
PROGRESS NOTE    Kelly Valencia  ZOX:[id number] DOB: [birth year] DOA: 03/29/2018 PCP: System, Pcp Not In    Brief Narrative:   77 year old [place] from [place] but now visiting the daughter in town, history of CHF, hypertension, diabetes, dementia who was brought by the daughter to the hospital due to increased confusion and lethargy.  She was found to have significant fluid overload in the ED as well as a UTI for which she was admitted.   Assessment & Plan:   Principal Problem:   CHF, acute on chronic (HCC) Active Problems:   Dementia (HCC)   Essential hypertension   Urinary tract infection without hematuria   Thrombocytopenia (HCC)   Diabetes mellitus type II, non insulin dependent (HCC)   Renal insufficiency   Pressure injury of skin of left heel   Acute metabolic/toxic encephalopathy Probably from urinary tract infection and acute on chronic systolic heart failure.  She appears to be back to baseline.  No agitation overnight or this morning.   Acute on chronic systolic heart failure Echocardiogram shows left ventricular ejection fraction around 10 to 15%.  She is on IV Lasix 40 mg twice daily for diuresis, switch to 40 mg of Lasix in the morning and 20 mg in the evening..  Continue with daily weights, strict intake and output and monitor urine output.  potassium within normal limits today.  Cardiology consulted and recommendations given. Entresto started by cardiology.  Creatinine appears to be better than baseline.   Acute urinary tract infection Urine culture showing Klebsiella and E. coli sensitive to Keflex.  On Keflex to complete the course.   Pressure injury to left heel present on admission wound care consulted and recommendations given. Stage II sacral ulcer Wound care consulted and recommendations given.   Chronic kidney disease stage II Improving with diuresis.  We will continue to monitor.  Creatinine better than baseline  Type 2 diabetes  mellitus Continue with sliding scale insulin while in the hospital. CBG (last 3)  Recent Labs    04/03/18 2142 04/04/18 0756 04/04/18 1155  GLUCAP 153* 106* 141*   No change in medications  Dementia Continue with Aricept   Hypertension Continue with Coreg  Hypokalemia Replaced, repeat level within normal limits  Pt stable for discharge . Awaiting bed at SNF.    DVT prophylaxis: SCDs Code Status: Full code Family Communication: None  at bedside disposition Plan: SNF when bed is available   Consultants:   Cardiology Dr. Duke Salviaandolph  Procedures: Echocardiogram Antimicrobials: None  Subjective: No new complaints today, no chest pain, shortness of breath nausea vomiting or abdominal pain.  Objective: Vitals:   04/04/18 0508 04/04/18 0514 04/04/18 0517 04/04/18 1257  BP:  109/73  104/72  Pulse: 92 73  70  Resp: 18 18  18   Temp: 98.7 F (37.1 C) 98 F (36.7 C)  98.3 F (36.8 C)  TempSrc: Oral Oral  Oral  SpO2: 100% 98%  99%  Weight:   88.1 kg   Height:        Intake/Output Summary (Last 24 hours) at 04/04/2018 1629 Last data filed at 04/04/2018 1216 Gross per 24 hour  Intake 643 ml  Output 1000 ml  Net -357 ml   Filed Weights   04/02/18 0619 04/03/18 0417 04/04/18 0517  Weight: 83.5 kg 84.8 kg 88.1 kg    Examination:  General exam: Calm, legally blind,, in good spirits Respiratory system: Clear to auscultation bilaterally, no wheezing or rhonchi Cardiovascular system: S1 & S2 heard,  RRR.   gastrointestinal system: Abdomen is soft, nontender, nondistended, good bowel sounds Central nervous system: Alert oriented to person and place Extremities: Improving pedal edema Skin: No rashes, lesions or ulcers Psychiatry:  Mood & affect appropriate.     Data Reviewed: I have personally reviewed following labs and imaging studies  CBC: Recent Labs  Lab 03/29/18 0040 03/30/18 0643  WBC 4.8 4.0  NEUTROABS 3.6  --   HGB 12.5 13.0  HCT 41.8 41.2  MCV  102.7* 98.6  PLT 95* 92*   Basic Metabolic Panel: Recent Labs  Lab 03/30/18 0643 03/31/18 0329 04/01/18 1534 04/03/18 0917 04/04/18 0604  NA 144 143 146* 143 143  K 4.4 3.1* 3.8 3.6 3.7  CL 109 106 106 104 105  CO2 21* 25 28 26 28   GLUCOSE 98 188* 102* 94 125*  BUN 25* 22 20 18 21   CREATININE 1.51* 1.29* 1.27* 1.16* 1.18*  CALCIUM 8.5* 8.3* 8.5* 8.0* 8.1*   GFR: Estimated Creatinine Clearance: 41.8 mL/min (A) (by C-G formula based on SCr of 1.18 mg/dL (H)). Liver Function Tests: Recent Labs  Lab 03/29/18 0040  AST 15  ALT 9  ALKPHOS 80  BILITOT 2.3*  PROT 6.8  ALBUMIN 2.8*   No results for input(s): LIPASE, AMYLASE in the last 168 hours. No results for input(s): AMMONIA in the last 168 hours. Coagulation Profile: No results for input(s): INR, PROTIME in the last 168 hours. Cardiac Enzymes: Recent Labs  Lab 03/29/18 0040  TROPONINI <0.03   BNP (last 3 results) No results for input(s): PROBNP in the last 8760 hours. HbA1C: No results for input(s): HGBA1C in the last 72 hours. CBG: Recent Labs  Lab 04/03/18 1205 04/03/18 1659 04/03/18 2142 04/04/18 0756 04/04/18 1155  GLUCAP 137* 123* 153* 106* 141*   Lipid Profile: No results for input(s): CHOL, HDL, LDLCALC, TRIG, CHOLHDL, LDLDIRECT in the last 72 hours. Thyroid Function Tests: No results for input(s): TSH, T4TOTAL, FREET4, T3FREE, THYROIDAB in the last 72 hours. Anemia Panel: No results for input(s): VITAMINB12, FOLATE, FERRITIN, TIBC, IRON, RETICCTPCT in the last 72 hours. Sepsis Labs: No results for input(s): PROCALCITON, LATICACIDVEN in the last 168 hours.  Recent Results (from the past 240 hour(s))  Urine Culture     Status: Abnormal   Collection Time: 03/29/18  2:45 AM  Result Value Ref Range Status   Specimen Description URINE, CATHETERIZED  Final   Special Requests   Final    NONE Performed at [facility], [address] [place], [place] [address]    Culture (A)  Final     >=100,000 COLONIES/mL ESCHERICHIA COLI >=100,000 COLONIES/mL PROTEUS MIRABILIS    Report Status 04/01/2018 FINAL  Final   Organism ID, Bacteria ESCHERICHIA COLI (A)  Final   Organism ID, Bacteria PROTEUS MIRABILIS (A)  Final      Susceptibility   Escherichia coli - MIC*    AMPICILLIN 16 INTERMEDIATE Intermediate     CEFAZOLIN <=4 SENSITIVE Sensitive     CEFTRIAXONE <=1 SENSITIVE Sensitive     CIPROFLOXACIN <=0.25 SENSITIVE Sensitive     GENTAMICIN <=1 SENSITIVE Sensitive     IMIPENEM <=0.25 SENSITIVE Sensitive     NITROFURANTOIN <=16 SENSITIVE Sensitive     TRIMETH/SULFA <=20 SENSITIVE Sensitive     AMPICILLIN/SULBACTAM 4 SENSITIVE Sensitive     PIP/TAZO <=4 SENSITIVE Sensitive     Extended ESBL NEGATIVE Sensitive     * >=100,000 COLONIES/mL ESCHERICHIA COLI   Proteus mirabilis - MIC*  AMPICILLIN <=2 SENSITIVE Sensitive     CEFAZOLIN <=4 SENSITIVE Sensitive     CEFTRIAXONE <=1 SENSITIVE Sensitive     CIPROFLOXACIN <=0.25 SENSITIVE Sensitive     GENTAMICIN <=1 SENSITIVE Sensitive     IMIPENEM 2 SENSITIVE Sensitive     NITROFURANTOIN 128 RESISTANT Resistant     TRIMETH/SULFA <=20 SENSITIVE Sensitive     AMPICILLIN/SULBACTAM <=2 SENSITIVE Sensitive     PIP/TAZO <=4 SENSITIVE Sensitive     * >=100,000 COLONIES/mL PROTEUS MIRABILIS         Radiology Studies: No results found.      Scheduled Meds: . carvedilol  3.125 mg Oral BID WC  . cephALEXin  500 mg Oral Q12H  . donepezil  5 mg Oral QHS  . furosemide  20 mg Oral QPM  . furosemide  40 mg Oral Daily  . insulin aspart  0-5 Units Subcutaneous QHS  . insulin aspart  0-9 Units Subcutaneous TID WC  . sacubitril-valsartan  1 tablet Oral BID  . sodium chloride flush  3 mL Intravenous Q12H   Continuous Infusions: . sodium chloride       LOS: 5 days    Time spent 26 minutes    Kathlen Mody, MD [facility] Pager [phone] If 7PM-7AM, please contact night-coverage [email or web address] Password  [facility] 04/04/2018, 4:29 PM

## 2018-04-04 NOTE — Clinical Social Work Placement (Signed)
   CLINICAL SOCIAL WORK PLACEMENT  NOTE  Date:  04/04/2018  Patient Details  Name: Kelly Valencia MRN: [id number] Date of Birth: [birth year]  Clinical Social Work is seeking post-discharge placement for this patient at the Skilled  Nursing Facility level of care (*CSW will initial, date and re-position this form in  chart as items are completed):      Patient/family provided with [facility] Clinical Social Work Department's list of facilities offering this level of care within the geographic area requested by the patient (or if unable, by the patient's family).      Patient/family informed of their freedom to choose among providers that offer the needed level of care, that participate in Medicare, Medicaid or managed care program needed by the patient, have an available bed and are willing to accept the patient.      Patient/family informed of [facility]'s ownership interest in [facility] and [facility], as well as of the fact that they are under no obligation to receive care at these facilities.  PASRR submitted to EDS on 04/04/18     PASRR number received on       Existing PASRR number confirmed on 04/04/18     FL2 transmitted to all facilities in geographic area requested by pt/family on 04/04/18     FL2 transmitted to all facilities within larger geographic area on       Patient informed that his/her managed care company has contracts with or will negotiate with certain facilities, including the following:            Patient/family informed of bed offers received.  Patient chooses bed at       Physician recommends and patient chooses bed at      Patient to be transferred to   on  .  Patient to be transferred to facility by       Patient family notified on   of transfer.  Name of family member notified:        PHYSICIAN Please sign FL2     Additional Comment:    _______________________________________________ Margarito Liner, LCSW 04/04/2018, 3:39  PM

## 2018-04-04 NOTE — NC FL2 (Signed)
[place] MEDICAID FL2 LEVEL OF CARE SCREENING TOOL     IDENTIFICATION  Patient Name: Kelly Valencia Birthdate: 10/15/1940 Sex: female Admission Date (Current Location): 03/29/2018  [place]  [facility]. Has been here in [place] visiting with daughter since 12/7.)   Facility and Address:  [facility], [address], [place], [place] [address]      Provider Number: [id number]  Attending Physician Name and Address:  Kathlen ModyAkula, Vijaya, MD  Relative Name and Phone Number:       Current Level of Care: Hospital Recommended Level of Care: Skilled Nursing Facility Prior Approval Number:    Date Approved/Denied:   PASRR Number: [id number]  Discharge Plan: SNF    Current Diagnoses: Patient Active Problem List   Diagnosis Date Noted  . CHF, acute on chronic (HCC) 03/29/2018  . Dementia (HCC) 03/29/2018  . Essential hypertension 03/29/2018  . Urinary tract infection without hematuria 03/29/2018  . Thrombocytopenia (HCC) 03/29/2018  . Diabetes mellitus type II, non insulin dependent (HCC) 03/29/2018  . Renal insufficiency 03/29/2018  . Pressure injury of skin of left heel 03/29/2018    Orientation RESPIRATION BLADDER Height & Weight     Self  Normal Continent, Indwelling catheter Weight: 194 lb 3.6 oz (88.1 kg) Height:  5\' 2"  (157.5 cm)  BEHAVIORAL SYMPTOMS/MOOD NEUROLOGICAL BOWEL NUTRITION STATUS  (None) (Dementia) Continent Diet(Heart healthy/carb modified)  AMBULATORY STATUS COMMUNICATION OF NEEDS Skin   Extensive Assist Verbally PU Stage and Appropriate Care, Other (Comment)(Blister.)   PU Stage 2 Dressing: (Mid sacrum: Open to air.)                   Personal Care Assistance Level of Assistance              Functional Limitations Info  Sight, Hearing, Speech Sight Info: Impaired(Blind) Hearing Info: Adequate Speech Info: Adequate    SPECIAL CARE FACTORS FREQUENCY  PT (By licensed PT), Blood  pressure     PT Frequency: 5 x week              Contractures Contractures Info: Not present    Additional Factors Info  Code Status, Allergies Code Status Info: Full Allergies Info: NKDA           Current Medications (04/04/2018):  This is the current hospital active medication list Current Facility-Administered Medications  Medication Dose Route Frequency Provider Last Rate Last Dose  . 0.9 %  sodium chloride infusion  250 mL Intravenous PRN Opyd, Lavone Neriimothy S, MD      . acetaminophen (TYLENOL) tablet 650 mg  650 mg Oral Q4H PRN Opyd, Lavone Neriimothy S, MD      . carvedilol (COREG) tablet 3.125 mg  3.125 mg Oral BID WC Opyd, Lavone Neriimothy S, MD   3.125 mg at 04/04/18 0855  . cephALEXin (KEFLEX) capsule 500 mg  500 mg Oral Q12H Kathlen ModyAkula, Vijaya, MD   500 mg at 04/04/18 0855  . donepezil (ARICEPT) tablet 5 mg  5 mg Oral QHS Opyd, Lavone Neriimothy S, MD   5 mg at 04/03/18 2112  . furosemide (LASIX) tablet 20 mg  20 mg Oral QPM Antoine PocheBranch, Jonathan F, MD   20 mg at 04/03/18 1717  . furosemide (LASIX) tablet 40 mg  40 mg Oral Daily Antoine PocheBranch, Jonathan F, MD   40 mg at 04/04/18 0855  . insulin aspart (novoLOG) injection 0-5 Units  0-5 Units Subcutaneous QHS Opyd, Lavone Neriimothy S, MD      . insulin aspart (  novoLOG) injection 0-9 Units  0-9 Units Subcutaneous TID WC Opyd, Lavone Neri, MD   1 Units at 04/04/18 1200  . ondansetron (ZOFRAN) injection 4 mg  4 mg Intravenous Q6H PRN Opyd, Lavone Neri, MD      . sacubitril-valsartan (ENTRESTO) 24-26 mg per tablet  1 tablet Oral BID Antoine Poche, MD   1 tablet at 04/04/18 0900  . sodium chloride flush (NS) 0.9 % injection 3 mL  3 mL Intravenous Q12H Opyd, Lavone Neri, MD   3 mL at 04/04/18 0858  . sodium chloride flush (NS) 0.9 % injection 3 mL  3 mL Intravenous PRN Opyd, Lavone Neri, MD         Discharge Medications: Please see discharge summary for a list of discharge medications.  Relevant Imaging Results:  Relevant Lab Results:   Additional Information SS#: [id number].  Plan is for no more than 20 days of rehab and then return home with daughter in [place]. She currently gets home health with [facility] in [place], [place] [phone]).  Margarito Liner, LCSW

## 2018-04-05 LAB — BASIC METABOLIC PANEL
Anion gap: 9 (ref 5–15)
BUN: 21 mg/dL (ref 8–23)
CO2: 29 mmol/L (ref 22–32)
Calcium: 8.3 mg/dL — ABNORMAL LOW (ref 8.9–10.3)
Chloride: 102 mmol/L (ref 98–111)
Creatinine, Ser: 1.15 mg/dL — ABNORMAL HIGH (ref 0.44–1.00)
GFR calc Af Amer: 54 mL/min — ABNORMAL LOW (ref >60)
GFR, EST NON AFRICAN AMERICAN: 46 mL/min — AB (ref >60)
Glucose, Bld: 71 mg/dL (ref 70–99)
Potassium: 3.8 mmol/L (ref 3.5–5.1)
SODIUM: 140 mmol/L (ref 135–145)

## 2018-04-05 LAB — GLUCOSE, CAPILLARY
Glucose-Capillary: 67 mg/dL — ABNORMAL LOW (ref 70–99)
Glucose-Capillary: 82 mg/dL (ref 70–99)
Glucose-Capillary: 116 mg/dL — ABNORMAL HIGH (ref 70–99)

## 2018-04-05 MED ORDER — FUROSEMIDE 20 MG PO TABS: 20.0000 mg | ORAL_TABLET | Freq: Every evening | ORAL | 0 refills | Status: DC

## 2018-04-05 MED ORDER — SACUBITRIL-VALSARTAN 24-26 MG PO TABS: 1.0000 | ORAL_TABLET | Freq: Two times a day (BID) | ORAL | 0 refills | Status: AC

## 2018-04-05 MED ORDER — FUROSEMIDE 40 MG PO TABS: 40.0000 mg | ORAL_TABLET | Freq: Every day | ORAL | 0 refills | Status: DC

## 2018-04-05 MED ORDER — CEPHALEXIN 500 MG PO CAPS: 500.0000 mg | ORAL_CAPSULE | Freq: Two times a day (BID) | ORAL | 0 refills | Status: AC

## 2018-04-05 NOTE — Progress Notes (Signed)
CSW spoke with daughter regarding SNF placement, agreeable to [facility]. Dr. Blake Divine working on DC summary, patient will dc to [facility] today.   [place], [place] [phone]

## 2018-04-05 NOTE — Discharge Summary (Signed)
Physician Discharge Summary  Jaquita RectorCarrie Hochmuth JXB:[id number] DOB: April 19, 1940 DOA: 03/29/2018  PCP: System, Pcp Not In  Admit date: 03/29/2018 Discharge date: 04/05/2018  Admitted From: SNF Disposition: SNF  Recommendations for Outpatient Follow-up:  1. Follow up with PCP in 1-2 weeks 2. Please obtain BMP/CBC in one week Please follow up with cardiology in 2 weeks.   Discharge Condition: guarded.  CODE STATUS: full code.  Diet recommendation: Heart Healthy  Brief/Interim Summary: 77 year old [place] but now visiting the daughter in town, history of CHF, hypertension, diabetes, dementia who was brought by the daughter to the hospital due to increased confusion and lethargy. She was found to have significant fluid overload in the ED as well as a UTI for which she was admitted.  Discharge Diagnoses:  Principal Problem:   CHF, acute on chronic (HCC) Active Problems:   Dementia (HCC)   Essential hypertension   Urinary tract infection without hematuria   Thrombocytopenia (HCC)   Diabetes mellitus type II, non insulin dependent (HCC)   Renal insufficiency   Pressure injury of skin of left heel   Acute metabolic/toxic encephalopathy Probably from urinary tract infection and acute on chronic systolic heart failure.  She appears to be back to baseline.  No agitation overnight or this morning.   Acute on chronic systolic heart failure Echocardiogram shows left ventricular ejection fraction around 10 to 15%.  She is on IV Lasix 40 mg twice daily for diuresis, switch to 40 mg of Lasix in the morning and 20 mg in the evening..  Continue with daily weights, strict intake and output and monitor urine output.  potassium within normal limits today.  Cardiology consulted and recommendations given. Entresto started by cardiology.  Creatinine appears to be better than baseline.  Acute urinary tract infection Urine culture showing Klebsiella and E. coli sensitive  to Keflex.  On Keflex to complete the course.   Pressure injury to left heel present on admission wound care consulted and recommendations given. Stage II sacral ulcer Wound care consulted and recommendations given.   Chronic kidney disease stage II Improving with diuresis.  We will continue to monitor.  Creatinine better than baseline  Type 2 diabetes mellitus CBG (last 3)  Recent Labs    04/04/18 2114 04/05/18 0743 04/05/18 0850  GLUCAP 101* 67* 82   LIBERALIZE THE DIET.   No change in medications  Dementia Continue with Aricept   Hypertension Continue with Coreg  Hypokalemia Replaced, repeat level within normal limits  Pt stable for discharge . Awaiting bed at SNF.     Discharge Instructions  Discharge Instructions    Diet - low sodium heart healthy   Complete by:  As directed    Discharge instructions   Complete by:  As directed    Please follow up with cardiology in 2 weeks.  Please follow up with PCP in one week.     Allergies as of 04/05/2018   No Known Allergies     Medication List    STOP taking these medications   ENTRESTO 49-51 MG Generic drug:  sacubitril-valsartan Replaced by:  sacubitril-valsartan 24-26 MG   lisinopril 2.5 MG tablet Commonly known as:  PRINIVIL,ZESTRIL   losartan 50 MG tablet Commonly known as:  COZAAR   metFORMIN 500 MG 24 hr tablet Commonly known as:  GLUCOPHAGE-XR     TAKE these medications   carvedilol 3.125 MG tablet Commonly known as:  COREG Take 3.125 mg by mouth 2 (two) times daily with a meal.  cephALEXin 500 MG capsule Commonly known as:  KEFLEX Take 1 capsule (500 mg total) by mouth every 12 (twelve) hours for 3 days.   donepezil 5 MG tablet Commonly known as:  ARICEPT Take 5 mg by mouth at bedtime.   furosemide 20 MG tablet Commonly known as:  LASIX Take 1 tablet (20 mg total) by mouth every evening. What changed:  You were already taking a medication with the same name, and  this prescription was added. Make sure you understand how and when to take each.   furosemide 40 MG tablet Commonly known as:  LASIX Take 1 tablet (40 mg total) by mouth daily. What changed:  Another medication with the same name was added. Make sure you understand how and when to take each.   sacubitril-valsartan 24-26 MG Commonly known as:  ENTRESTO Take 1 tablet by mouth 2 (two) times daily. Replaces:  ENTRESTO 49-51 MG       Contact information for follow-up providers    Chilton Si, MD. Schedule an appointment as soon as possible for a visit in 2 week(s).   Specialty:  Cardiology Contact information: [address] [place] [address] [phone]            Contact information for after-discharge care    Destination    [facility] Preferred SNF .   Service:  Skilled Nursing Contact information: [address] [place] [address] [phone]                 No Known Allergies  Consultations:  Cardiology.    Procedures/Studies: Ct Abdomen Pelvis Wo Contrast  Result Date: 03/29/2018 CLINICAL DATA:  Abdominal distension. EXAM: CT ABDOMEN AND PELVIS WITHOUT CONTRAST TECHNIQUE: Multidetector CT imaging of the abdomen and pelvis was performed following the standard protocol without IV contrast. COMPARISON:  None. FINDINGS: Lower chest: Moderate bilateral pleural effusions with compressive atelectasis in the lower lobes. Opacity in the lingula has the appearance of atelectasis. Multi chamber cardiomegaly with coronary artery calcifications. Hepatobiliary: No discrete focal hepatic lesion on noncontrast exam. High-density material in the region of the gallbladder fossa are presumed gallstones, however gallbladder is not well-defined. Pancreas: Streak artifact from overlying artifact obscures the pancreas. Spleen: Normal in size. Adrenals/Urinary Tract: Mild left adrenal thickening without nodule. The right adrenal  gland is normal. Bilateral renal parenchymal thinning. No hydronephrosis. Streak artifact from external artifact partially obscures the left kidney. Foley catheter decompresses the urinary bladder. Stomach/Bowel: Bowel evaluation is limited in the absence of contrast, streak artifact, and patient motion artifact. No evidence of bowel obstruction or inflammatory change. Normal appendix. Small to moderate colonic stool burden. Vascular/Lymphatic: Aortic atherosclerosis. No aneurysm. Significantly limited assessment for adenopathy. Reproductive: Uterus is unremarkable.  No gross adnexal mass. Other: Marked diffuse body wall edema and edema throughout the abdominal mesentery. Probable small volume ascites. No free air. Musculoskeletal: T12 and L3 superior endplate compression fractures, likely chronic. Multilevel degenerative change in the spine. There are no acute or suspicious osseous abnormalities. IMPRESSION: 1. Marked body wall and soft tissue edema. Bilateral pleural effusions, at least moderate in degree. Trace ascites. Findings consistent with third spacing or fluid overload. 2. Probable gallstones, gallbladder is not well-defined. 3.  Aortic Atherosclerosis (ICD10-I70.0). Electronically Signed   By: Narda Rutherford M.D.   On: 03/29/2018 05:40   Dg Chest 2 View  Result Date: 03/29/2018 CLINICAL DATA:  Weakness. EXAM: CHEST - 2 VIEW COMPARISON:  None. FINDINGS: The heart is enlarged. There are small  to moderate bilateral pleural effusions. Vascular congestion without overt pulmonary edema. No focal airspace disease. No pneumothorax. The bones are under mineralized. IMPRESSION: Cardiomegaly with small to moderate bilateral pleural effusions and vascular congestion. Findings consistent with fluid overload/CHF. Electronically Signed   By: Narda Rutherford M.D.   On: 03/29/2018 01:41   Ct Head Wo Contrast  Result Date: 03/29/2018 CLINICAL DATA:  Weakness and dizziness. Altered level of consciousness (LOC),  unexplained; Polytrauma, critical, head/C-spine injury suspected EXAM: CT HEAD WITHOUT CONTRAST CT CERVICAL SPINE WITHOUT CONTRAST TECHNIQUE: Multidetector CT imaging of the head and cervical spine was performed following the standard protocol without intravenous contrast. Multiplanar CT image reconstructions of the cervical spine were also generated. COMPARISON:  None. FINDINGS: CT HEAD FINDINGS Brain: Generalized atrophy. Moderate to advanced chronic small vessel ischemia. Remote lacunar infarct in the left cerebellum and basal ganglia. No intracranial hemorrhage, mass effect, or midline shift. No hydrocephalus. The basilar cisterns are patent. No evidence of territorial infarct or acute ischemia. No extra-axial or intracranial fluid collection. Vascular: Atherosclerosis of skullbase vasculature without hyperdense vessel or abnormal calcification. Skull: No fracture or suspicious lesion. Probable focal hyperostosis in the right frontal region. Sinuses/Orbits: Bilateral globe deformity, more prominent on the left with intra-ocular high-density material, calcifications versus surgical material. Mild mucosal thickening of the paranasal sinuses. The mastoid air cells are clear. Postsurgical change of the nose. Other: None. CT CERVICAL SPINE FINDINGS Alignment: No traumatic subluxation. Trace anterolisthesis of C4 on C5 appears degenerative. Skull base and vertebrae: No acute fracture. Vertebral body heights are maintained. The dens and skull base are intact. Soft tissues and spinal canal: No spinal canal hematoma. Fluid in the hypopharynx. Disc levels: Disc space narrowing and endplate spurring Z6-X0. Multilevel facet arthropathy. Upper chest: Large bilateral pleural effusions. Other: Carotid calcifications. IMPRESSION: 1. No acute intracranial abnormality. No skull fracture. 2. Generalized atrophy and chronic small vessel ischemia. 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 3. Degenerative  change in the cervical spine without acute fracture or subluxation. 4. Bilateral pleural effusions, as seen on chest radiograph earlier this day. Electronically Signed   By: Narda Rutherford M.D.   On: 03/29/2018 05:11   Ct Cervical Spine Wo Contrast  Result Date: 03/29/2018 CLINICAL DATA:  Weakness and dizziness. Altered level of consciousness (LOC), unexplained; Polytrauma, critical, head/C-spine injury suspected EXAM: CT HEAD WITHOUT CONTRAST CT CERVICAL SPINE WITHOUT CONTRAST TECHNIQUE: Multidetector CT imaging of the head and cervical spine was performed following the standard protocol without intravenous contrast. Multiplanar CT image reconstructions of the cervical spine were also generated. COMPARISON:  None. FINDINGS: CT HEAD FINDINGS Brain: Generalized atrophy. Moderate to advanced chronic small vessel ischemia. Remote lacunar infarct in the left cerebellum and basal ganglia. No intracranial hemorrhage, mass effect, or midline shift. No hydrocephalus. The basilar cisterns are patent. No evidence of territorial infarct or acute ischemia. No extra-axial or intracranial fluid collection. Vascular: Atherosclerosis of skullbase vasculature without hyperdense vessel or abnormal calcification. Skull: No fracture or suspicious lesion. Probable focal hyperostosis in the right frontal region. Sinuses/Orbits: Bilateral globe deformity, more prominent on the left with intra-ocular high-density material, calcifications versus surgical material. Mild mucosal thickening of the paranasal sinuses. The mastoid air cells are clear. Postsurgical change of the nose. Other: None. CT CERVICAL SPINE FINDINGS Alignment: No traumatic subluxation. Trace anterolisthesis of C4 on C5 appears degenerative. Skull base and vertebrae: No acute fracture. Vertebral body heights are maintained. The dens and skull base are intact. Soft tissues and spinal canal:  No spinal canal hematoma. Fluid in the hypopharynx. Disc levels: Disc space  narrowing and endplate spurring W9-V9. Multilevel facet arthropathy. Upper chest: Large bilateral pleural effusions. Other: Carotid calcifications. IMPRESSION: 1. No acute intracranial abnormality. No skull fracture. 2. Generalized atrophy and chronic small vessel ischemia. 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 3. Degenerative change in the cervical spine without acute fracture or subluxation. 4. Bilateral pleural effusions, as seen on chest radiograph earlier this day. Electronically Signed   By: Narda Rutherford M.D.   On: 03/29/2018 05:11     Subjective: No new complaints.   Discharge Exam: Vitals:   04/05/18 0405 04/05/18 0823  BP: 106/64 112/81  Pulse: 70 67  Resp: 18   Temp: (!) 97.5 F (36.4 C) (!) 97.4 F (36.3 C)  SpO2: 95% 99%   Vitals:   04/04/18 1257 04/04/18 1934 04/05/18 0405 04/05/18 0823  BP: 104/72 109/71 106/64 112/81  Pulse: 70 73 70 67  Resp: 18 18 18    Temp: 98.3 F (36.8 C) (!) 97.5 F (36.4 C) (!) 97.5 F (36.4 C) (!) 97.4 F (36.3 C)  TempSrc: Oral Oral Oral Oral  SpO2: 99% 97% 95% 99%  Weight:   84.9 kg   Height:        General: Pt is alert, awake, not in acute distress Cardiovascular: RRR, S1/S2 +, no rubs, no gallops Respiratory: CTA bilaterally, no wheezing, no rhonchi Abdominal: Soft, NT, ND, bowel sounds + Extremities: no edema, no cyanosis    The results of significant diagnostics from this hospitalization (including imaging, microbiology, ancillary and laboratory) are listed below for reference.     Microbiology: Recent Results (from the past 240 hour(s))  Urine Culture     Status: Abnormal   Collection Time: 03/29/18  2:45 AM  Result Value Ref Range Status   Specimen Description URINE, CATHETERIZED  Final   Special Requests   Final    NONE Performed at [facility], [address], [place], [place] [address]    Culture (A)  Final    >=100,000 COLONIES/mL ESCHERICHIA COLI >=100,000 COLONIES/mL  PROTEUS MIRABILIS    Report Status 04/01/2018 FINAL  Final   Organism ID, Bacteria ESCHERICHIA COLI (A)  Final   Organism ID, Bacteria PROTEUS MIRABILIS (A)  Final      Susceptibility   Escherichia coli - MIC*    AMPICILLIN 16 INTERMEDIATE Intermediate     CEFAZOLIN <=4 SENSITIVE Sensitive     CEFTRIAXONE <=1 SENSITIVE Sensitive     CIPROFLOXACIN <=0.25 SENSITIVE Sensitive     GENTAMICIN <=1 SENSITIVE Sensitive     IMIPENEM <=0.25 SENSITIVE Sensitive     NITROFURANTOIN <=16 SENSITIVE Sensitive     TRIMETH/SULFA <=20 SENSITIVE Sensitive     AMPICILLIN/SULBACTAM 4 SENSITIVE Sensitive     PIP/TAZO <=4 SENSITIVE Sensitive     Extended ESBL NEGATIVE Sensitive     * >=100,000 COLONIES/mL ESCHERICHIA COLI   Proteus mirabilis - MIC*    AMPICILLIN <=2 SENSITIVE Sensitive     CEFAZOLIN <=4 SENSITIVE Sensitive     CEFTRIAXONE <=1 SENSITIVE Sensitive     CIPROFLOXACIN <=0.25 SENSITIVE Sensitive     GENTAMICIN <=1 SENSITIVE Sensitive     IMIPENEM 2 SENSITIVE Sensitive     NITROFURANTOIN 128 RESISTANT Resistant     TRIMETH/SULFA <=20 SENSITIVE Sensitive     AMPICILLIN/SULBACTAM <=2 SENSITIVE Sensitive     PIP/TAZO <=4 SENSITIVE Sensitive     * >=100,000 COLONIES/mL PROTEUS MIRABILIS  Labs: BNP (last 3 results) Recent Labs    03/29/18 0040  BNP >4,500.0*   Basic Metabolic Panel: Recent Labs  Lab 03/31/18 0329 04/01/18 1534 04/03/18 0917 04/04/18 0604 04/05/18 0515  NA 143 146* 143 143 140  K 3.1* 3.8 3.6 3.7 3.8  CL 106 106 104 105 102  CO2 25 28 26 28 29   GLUCOSE 188* 102* 94 125* 71  BUN 22 20 18 21 21   CREATININE 1.29* 1.27* 1.16* 1.18* 1.15*  CALCIUM 8.3* 8.5* 8.0* 8.1* 8.3*   Liver Function Tests: No results for input(s): AST, ALT, ALKPHOS, BILITOT, PROT, ALBUMIN in the last 168 hours. No results for input(s): LIPASE, AMYLASE in the last 168 hours. No results for input(s): AMMONIA in the last 168 hours. CBC: Recent Labs  Lab 03/30/18 0643  WBC 4.0  HGB 13.0   HCT 41.2  MCV 98.6  PLT 92*   Cardiac Enzymes: No results for input(s): CKTOTAL, CKMB, CKMBINDEX, TROPONINI in the last 168 hours. BNP: Invalid input(s): POCBNP CBG: Recent Labs  Lab 04/04/18 1614 04/04/18 2032 04/04/18 2114 04/05/18 0743 04/05/18 0850  GLUCAP 122* 52* 101* 67* 82   D-Dimer No results for input(s): DDIMER in the last 72 hours. Hgb A1c No results for input(s): HGBA1C in the last 72 hours. Lipid Profile No results for input(s): CHOL, HDL, LDLCALC, TRIG, CHOLHDL, LDLDIRECT in the last 72 hours. Thyroid function studies No results for input(s): TSH, T4TOTAL, T3FREE, THYROIDAB in the last 72 hours.  Invalid input(s): FREET3 Anemia work up No results for input(s): VITAMINB12, FOLATE, FERRITIN, TIBC, IRON, RETICCTPCT in the last 72 hours. Urinalysis    Component Value Date/Time   COLORURINE AMBER (A) 03/29/2018 0245   APPEARANCEUR CLOUDY (A) 03/29/2018 0245   LABSPEC 1.011 03/29/2018 0245   PHURINE 7.0 03/29/2018 0245   GLUCOSEU NEGATIVE 03/29/2018 0245   HGBUR MODERATE (A) 03/29/2018 0245   BILIRUBINUR NEGATIVE 03/29/2018 0245   KETONESUR NEGATIVE 03/29/2018 0245   PROTEINUR 30 (A) 03/29/2018 0245   NITRITE NEGATIVE 03/29/2018 0245   LEUKOCYTESUR LARGE (A) 03/29/2018 0245   Sepsis Labs Invalid input(s): PROCALCITONIN,  WBC,  LACTICIDVEN Microbiology Recent Results (from the past 240 hour(s))  Urine Culture     Status: Abnormal   Collection Time: 03/29/18  2:45 AM  Result Value Ref Range Status   Specimen Description URINE, CATHETERIZED  Final   Special Requests   Final    NONE Performed at [facility], [address], [place], [place] [address]    Culture (A)  Final    >=100,000 COLONIES/mL ESCHERICHIA COLI >=100,000 COLONIES/mL PROTEUS MIRABILIS    Report Status 04/01/2018 FINAL  Final   Organism ID, Bacteria ESCHERICHIA COLI (A)  Final   Organism ID, Bacteria PROTEUS MIRABILIS (A)  Final      Susceptibility   Escherichia coli -  MIC*    AMPICILLIN 16 INTERMEDIATE Intermediate     CEFAZOLIN <=4 SENSITIVE Sensitive     CEFTRIAXONE <=1 SENSITIVE Sensitive     CIPROFLOXACIN <=0.25 SENSITIVE Sensitive     GENTAMICIN <=1 SENSITIVE Sensitive     IMIPENEM <=0.25 SENSITIVE Sensitive     NITROFURANTOIN <=16 SENSITIVE Sensitive     TRIMETH/SULFA <=20 SENSITIVE Sensitive     AMPICILLIN/SULBACTAM 4 SENSITIVE Sensitive     PIP/TAZO <=4 SENSITIVE Sensitive     Extended ESBL NEGATIVE Sensitive     * >=100,000 COLONIES/mL ESCHERICHIA COLI   Proteus mirabilis - MIC*    AMPICILLIN <=2 SENSITIVE Sensitive  CEFAZOLIN <=4 SENSITIVE Sensitive     CEFTRIAXONE <=1 SENSITIVE Sensitive     CIPROFLOXACIN <=0.25 SENSITIVE Sensitive     GENTAMICIN <=1 SENSITIVE Sensitive     IMIPENEM 2 SENSITIVE Sensitive     NITROFURANTOIN 128 RESISTANT Resistant     TRIMETH/SULFA <=20 SENSITIVE Sensitive     AMPICILLIN/SULBACTAM <=2 SENSITIVE Sensitive     PIP/TAZO <=4 SENSITIVE Sensitive     * >=100,000 COLONIES/mL PROTEUS MIRABILIS     Time coordinating discharge:35 minutes  SIGNED:   Kathlen Mody, MD  [facility] 04/05/2018, 11:03 AM Pager   If 7PM-7AM, please contact night-coverage [email or web address] Password [email or web address]

## 2018-04-05 NOTE — Progress Notes (Signed)
Patient will DC to: [facility] Anticipated DC date: 04/05/18 Family notified: [facility] Transport by: Sharin Mons  Per MD patient ready for DC to [facility] . RN, patient, patient's family, and facility notified of DC. Discharge Summary sent to facility. RN given number for report [phone] . DC packet on chart. Ambulance transport requested for patient.  CSW signing off.  [place], [place] [phone]

## 2018-04-05 NOTE — Progress Notes (Signed)
Given pt report to [facility]. Facility notified about pt condition, d/c intructions and changes in medication.

## 2018-04-05 NOTE — Clinical Social Work Placement (Signed)
   CLINICAL SOCIAL WORK PLACEMENT  NOTE  Date:  04/05/2018  Patient Details  Name: Kelly Valencia MRN: [id number] Date of Birth: 03-Feb-1941  Clinical Social Work is seeking post-discharge placement for this patient at the Skilled  Nursing Facility level of care (*CSW will initial, date and re-position this form in  chart as items are completed):      Patient/family provided with [facility] Clinical Social Work Department's list of facilities offering this level of care within the geographic area requested by the patient (or if unable, by the patient's family).      Patient/family informed of their freedom to choose among providers that offer the needed level of care, that participate in Medicare, Medicaid or managed care program needed by the patient, have an available bed and are willing to accept the patient.      Patient/family informed of [facility]'s ownership interest in [facility] and [facility], as well as of the fact that they are under no obligation to receive care at these facilities.  PASRR submitted to EDS on 04/04/18     PASRR number received on       Existing PASRR number confirmed on 04/04/18     FL2 transmitted to all facilities in geographic area requested by pt/family on 04/04/18     FL2 transmitted to all facilities within larger geographic area on 04/04/18  Patient informed that his/her managed care company has contracts with or will negotiate with certain facilities, including the following:            Patient/family informed of bed offers received.  Patient chooses bed at [facility]  Physician recommends and patient chooses bed at      Patient to be transferred to [facility] on 04/05/18.  Patient to be transferred to facility by [facility]  Patient family notified on 04/05/18 of transfer.  Name of family member notified:  Mariama   PHYSICIAN Please sign FL2     Additional Comment:     _______________________________________________ Gildardo Griffes, LCSW 04/05/2018, 10:24 AM

## 2018-04-26 ENCOUNTER — Encounter: Payer: Self-pay | Admitting: Cardiology

## 2018-04-26 ENCOUNTER — Ambulatory Visit (INDEPENDENT_AMBULATORY_CARE_PROVIDER_SITE_OTHER): Payer: Medicare Other | Admitting: Cardiology

## 2018-04-26 DIAGNOSIS — G309 Alzheimer's disease, unspecified: Secondary | ICD-10-CM

## 2018-04-26 DIAGNOSIS — F0280 Dementia in other diseases classified elsewhere without behavioral disturbance: Secondary | ICD-10-CM | POA: Diagnosis not present

## 2018-04-26 DIAGNOSIS — I5023 Acute on chronic systolic (congestive) heart failure: Secondary | ICD-10-CM

## 2018-04-26 DIAGNOSIS — I428 Other cardiomyopathies: Secondary | ICD-10-CM | POA: Insufficient documentation

## 2018-04-26 MED ORDER — FUROSEMIDE 80 MG PO TABS: 80.0000 mg | ORAL_TABLET | Freq: Every day | ORAL | 1 refills | Status: AC

## 2018-04-26 NOTE — Progress Notes (Signed)
04/26/2018 Kelly Valencia   1940/06/16  [id number]  Primary Physician System, Pcp Not In Primary Cardiologist: Billie Lade   HPI: Patient is a 78 year old demented female from [place] who was in [place] visiting her daughter who noted she was retaining fluid and brought her to the emergency room. The patient has a history of chronic systolic heart failure.  She is followed by a cardiologist in [place] where another daughter lives.  This is a presumably nonischemic cardiomyopathy.  Echocardiogram on admission showed an ejection fraction of 10%.  Other medical issues include hypertension and diabetes.  The patient was admitted for diuresis.  She was uncooperative with labs and physical exam.  She diuresed 5-6L. Weights were felt to be unreliable.  She was discharged to [facility].  She is in the office today to be seen before returning to [place].  On exam she has LE edema to her thighs. There is no apparent increased SOB but her history is unreliable.  Her daughter thinks she is doing OK except for the swelling.    Current Outpatient Medications  Medication Sig Dispense Refill  . carvedilol (COREG) 3.125 MG tablet Take 3.125 mg by mouth 2 (two) times daily with a meal.    . donepezil (ARICEPT) 5 MG tablet Take 5 mg by mouth at bedtime.    . sacubitril-valsartan (ENTRESTO) 24-26 MG Take 1 tablet by mouth 2 (two) times daily. 60 tablet 0  . furosemide (LASIX) 80 MG tablet Take 1 tablet (80 mg total) by mouth daily. 30 tablet 1   No current facility-administered medications for this visit.     No Known Allergies  Past Medical History:  Diagnosis Date  . Dementia (HCC)   . Stroke [facility])     Social History   Socioeconomic History  . Marital status: Widowed    Spouse name: Not on file  . Number of children: Not on file  . Years of education: Not on file  . Highest education level: Not on file  Occupational History  . Not on file    Social Needs  . Financial resource strain: Not on file  . Food insecurity:    Worry: Not on file    Inability: Not on file  . Transportation needs:    Medical: Not on file    Non-medical: Not on file  Tobacco Use  . Smoking status: Never Smoker  . Smokeless tobacco: Never Used  Substance and Sexual Activity  . Alcohol use: Never    Frequency: Never  . Drug use: Never  . Sexual activity: Not on file  Lifestyle  . Physical activity:    Days per week: Not on file    Minutes per session: Not on file  . Stress: Not on file  Relationships  . Social connections:    Talks on phone: Not on file    Gets together: Not on file    Attends religious service: Not on file    Active member of club or organization: Not on file    Attends meetings of clubs or organizations: Not on file    Relationship status: Not on file  . Intimate partner violence:    Fear of current or ex partner: Not on file    Emotionally abused: Not on file    Physically abused: Not on file    Forced sexual activity: Not on file  Other Topics Concern  . Not on file  Social History Narrative  . Not on file  Family History  Problem Relation Age of Onset  . Developmental delay Son      Review of Systems: General: negative for chills, fever, night sweats or weight changes.  Cardiovascular: negative for chest pain, dyspnea on exertion, edema, orthopnea, palpitations, paroxysmal nocturnal dyspnea or shortness of breath Dermatological: negative for rash Respiratory: negative for cough or wheezing Urologic: negative for hematuria Abdominal: negative for nausea, vomiting, diarrhea, bright red blood per rectum, melena, or hematemesis Neurologic: negative for visual changes, syncope, or dizziness All other systems reviewed and are otherwise negative except as noted above.    Blood pressure 106/64, pulse 80, height 5\' 3"  (1.6 m).  General appearance: alert, cooperative, no distress and confused Neck: no  JVD Lungs: decreased breath sounds Heart: regular rate and rhythm Abdomen: obese non tender Extremities: edema noted to her thoighs and her rt hand Skin: warm and dry Neurologic: Grossly normal   ASSESSMENT AND PLAN:   Acute on chronic CHF From [place] with H/O dementia and NICM- admitted 12/18-12/24/19 with CHF-DC's to SNF prior to returning to [place].  She still has volume overload on exam  NICM EF 10%  by echo Dec 2019  Dementia Unable to cooperate with labs or give history  PLAN  Increase Lasix to 80 mg daily (currently on 40/20)- I spoke with the pt's daughter.  The patient will need close follow up with her cardiologist in [place].   Corine Shelter PA-C 04/26/2018 12:56 PM

## 2018-04-26 NOTE — Assessment & Plan Note (Signed)
From [place] with H/O dementia and NICM- admitted 12/18-12/24/19 with CHF-DC's to SNF prior to returning to [place].  She still has volume overload on exam

## 2018-04-26 NOTE — Assessment & Plan Note (Signed)
Unable to cooperate with labs or give history

## 2018-04-26 NOTE — Patient Instructions (Addendum)
Medication Instructions:   Increase Lasix to 80 MG daily   If you need a refill on your cardiac medications before your next appointment, please call your pharmacy.   Lab work: NONE  Testing/Procedures:  NONE   Follow-Up:  Your physician recommends that you follow-up AS NEEDED   Any Other Special Instructions Will Be Listed Below (If Applicable).

## 2018-04-26 NOTE — Assessment & Plan Note (Signed)
EF 10%  by echo Dec 2019

## 2018-05-03 ENCOUNTER — Telehealth: Payer: Self-pay | Admitting: Cardiology

## 2018-05-03 NOTE — Telephone Encounter (Signed)
Spoke to [employer] with [facility].PT requesting physical threapy twice a week for 3 weeks.Advised I will send message to Lossie Faes PA for a order.

## 2018-05-03 NOTE — Telephone Encounter (Signed)
New message   Need verbal order for physical therapy for twice a week for 3 weeks. Please advise.

## 2018-05-06 NOTE — Telephone Encounter (Signed)
That needs to come from her PCP  Corine Shelter PA-C 05/06/2018 12:46 PM

## 2018-05-10 NOTE — Telephone Encounter (Signed)
  Nicolette is calling back because Ms Leinenbach does not have a PCP. She is requesting orders for physical therapy and a hospital bed with pressure relieving mattress and bedside commode. They can fax orders for Corine Shelter to sign if he is willing to do it.

## 2018-05-10 NOTE — Telephone Encounter (Signed)
Returned call to [employer] with [facility] Franky Macho advised will need to get orders from PCP.

## 2018-05-11 ENCOUNTER — Telehealth: Payer: Self-pay | Admitting: Cardiology

## 2018-05-14 NOTE — Telephone Encounter (Signed)
New message     Paramedic stated that pt is deceased and needs death certificate

## 2018-05-14 NOTE — Telephone Encounter (Signed)
J.[employer] EMT called to inform patient is deceased and would like to know if Corine Shelter PA SIGNED DEATH CERTIFICATE since  Patient last visit was on 04/26/18. Informed EMT - [facility] IS NOT IN OFFICE TODAY. Spoke to Dr Duke Salvia who saw patient in hospital.She states she would signed  Certificate. [employer] EMT verbalized understanding. Marland Kitchen

## 2018-05-14 DEATH — deceased

## 2018-05-27 NOTE — Telephone Encounter (Signed)
Message routed to Emi Belfast in medical records NL.

## 2018-05-27 NOTE — Telephone Encounter (Signed)
Turkey from [facility] is calling to get Death Certificate signed. Pt passed away 05-28-18. [facility] needs  Death Certificate signed

## 2018-05-27 NOTE — Telephone Encounter (Signed)
° °  Follow up    Tur[place] calling back for status of death certificate

## 2018-05-30 ENCOUNTER — Telehealth: Payer: Self-pay | Admitting: Cardiovascular Disease

## 2018-05-30 NOTE — Telephone Encounter (Signed)
New Message         Mika with [facility]. Is calling for a Death Certificate that was mailed to [facility] on 08/28/2017 for the above patient to. Pls call and advise.

## 2018-05-30 NOTE — Telephone Encounter (Signed)
Signed death certificate given to medical records

## 2019-06-04 IMAGING — CT CT CERVICAL SPINE W/O CM
5 of 7 series · 13 of 33 positions shown, 14 images · non-contrast
Comparison: None.

CLINICAL DATA: Weakness and dizziness. Altered level of
consciousness (LOC), unexplained; Polytrauma, critical, head/C-spine
injury suspected

EXAM:
CT HEAD WITHOUT CONTRAST
CT CERVICAL SPINE WITHOUT CONTRAST
TECHNIQUE: Multidetector CT imaging of the head and cervical spine was
performed following the standard protocol without intravenous
contrast. Multiplanar CT image reconstructions of the cervical spine
were also generated.

[Series 4: head bone · axial · 0.44mm/px · z∈[-58,-8]mm · 2 of 76 slices shown]
[im 26/76  bone]
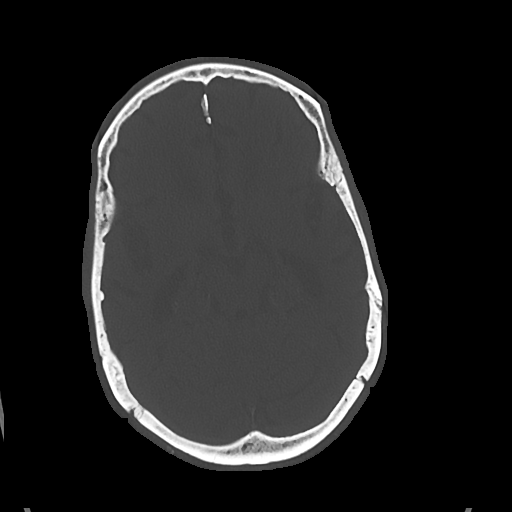
[im 51/76  bone]
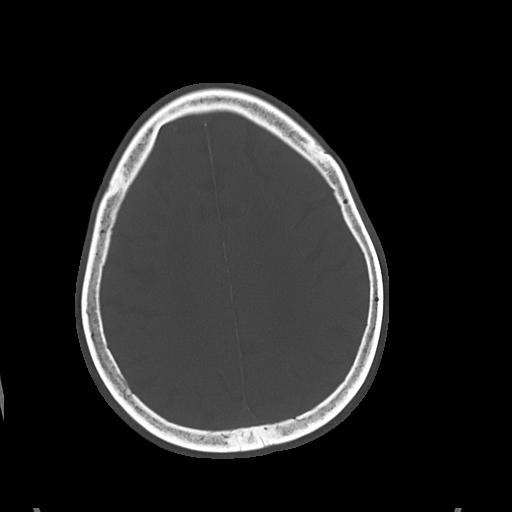

[Series 5: cor soft · coronal · 0.31mm/px · 3 of 68 slices shown]
[im 14/68  bone]
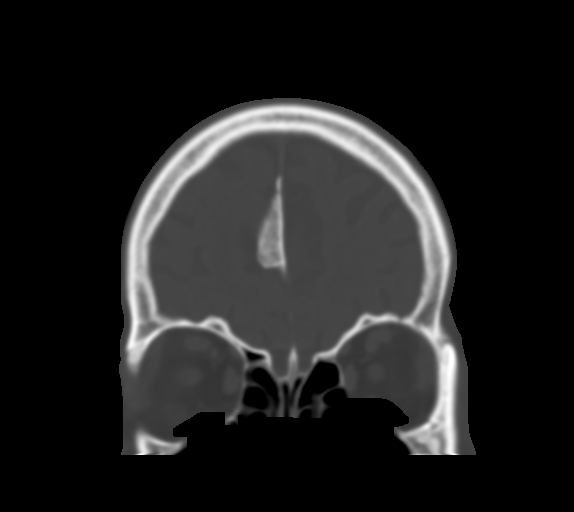
[im 27/68  bone]
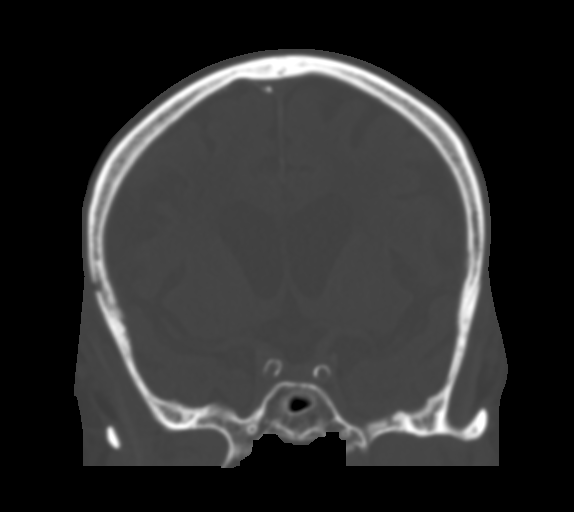
[im 41/68  bone]
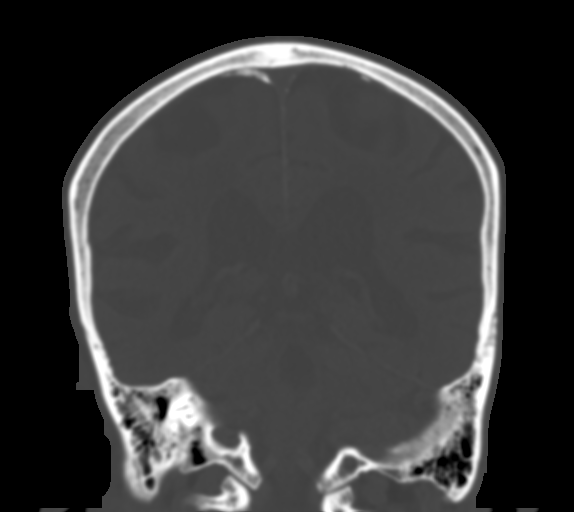

[Series 8: c spine soft · axial · 0.36mm/px · z∈[-174,-128]mm · 2 of 71 slices shown]
[im 24/71  soft-tissue]
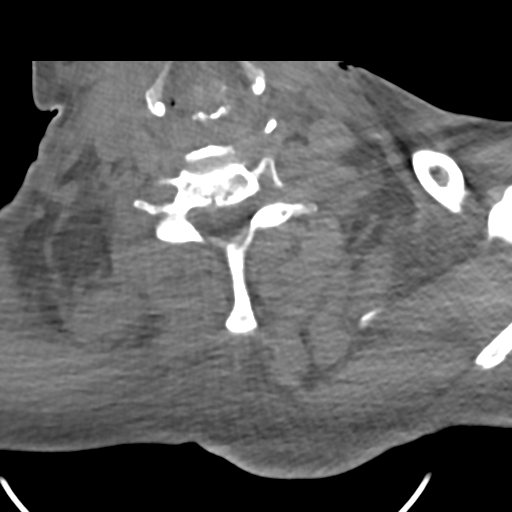
[im 47/71  soft-tissue]
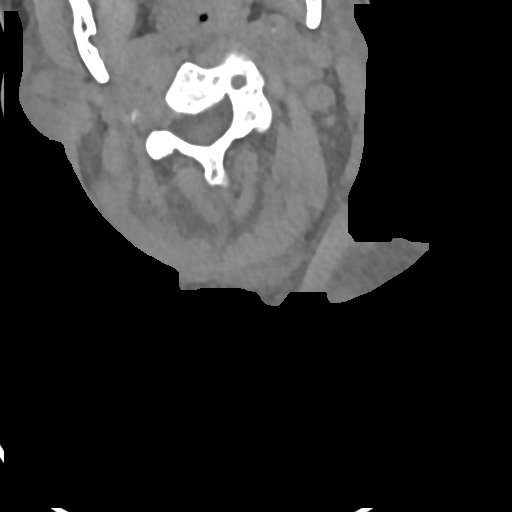

[Series 9: sag bone · sagittal · 0.28mm/px · 4 of 61 slices shown]
[im 13/61  bone]
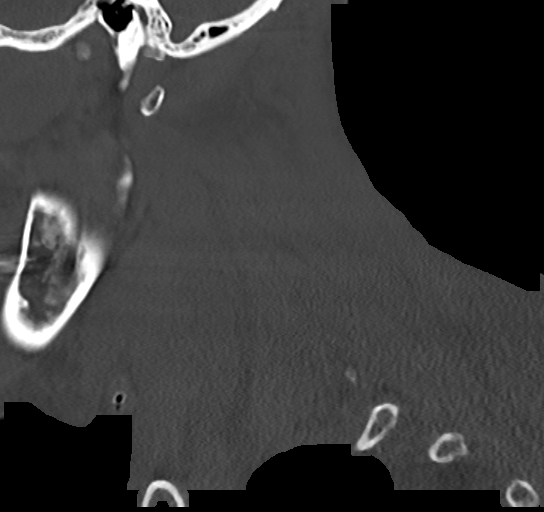
[im 25/61  bone]
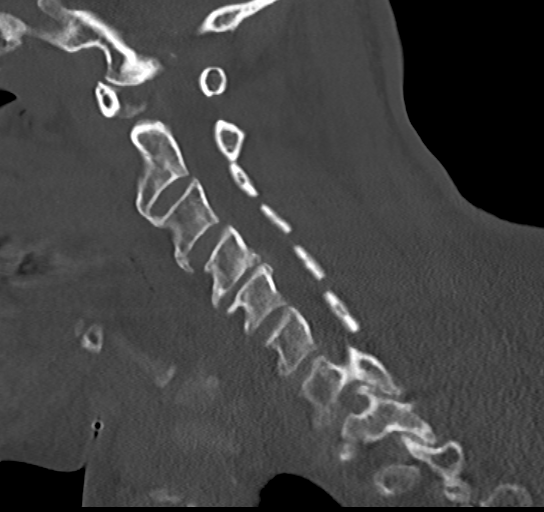
[im 37/61  bone]
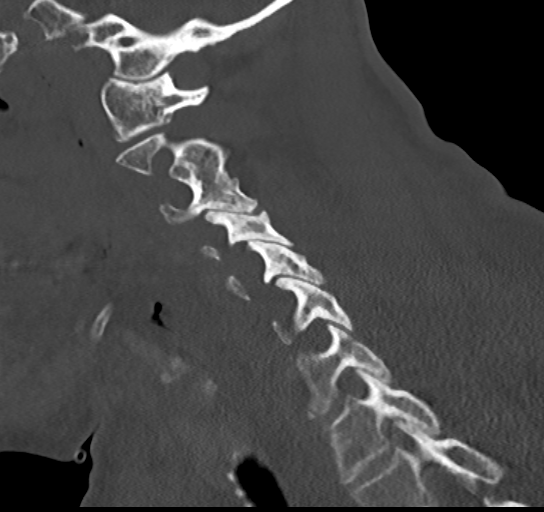
[im 49/61  bone]
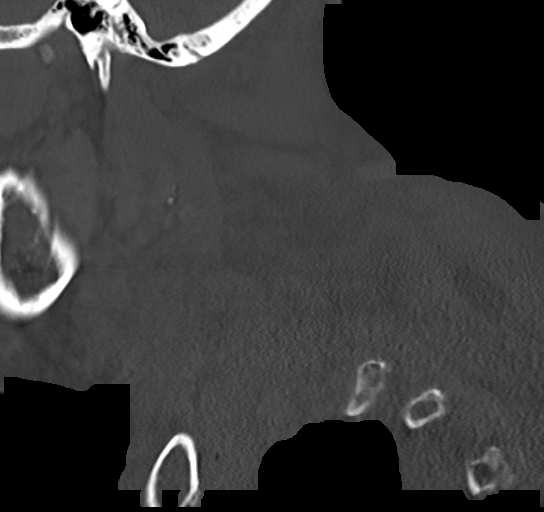

[Series 12: orthogonal axials · axial · 0.21mm/px · z∈[-199,-161]mm · 2 of 81 slices shown, 3 images]
[im 27/81  soft-tissue]
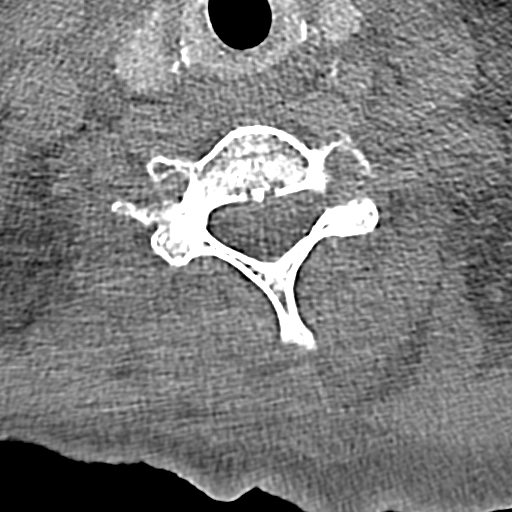
[im 27/81  bone]
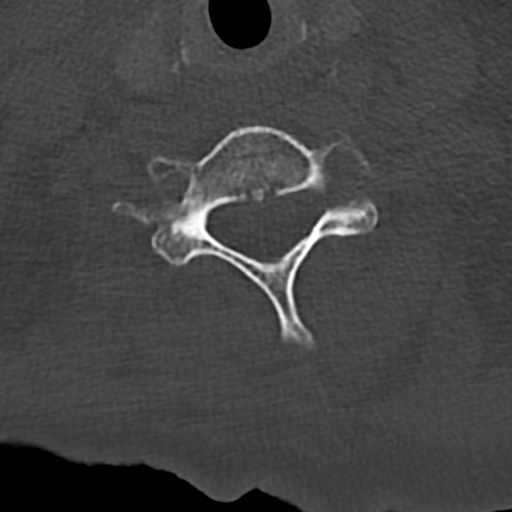
[im 54/81  bone]
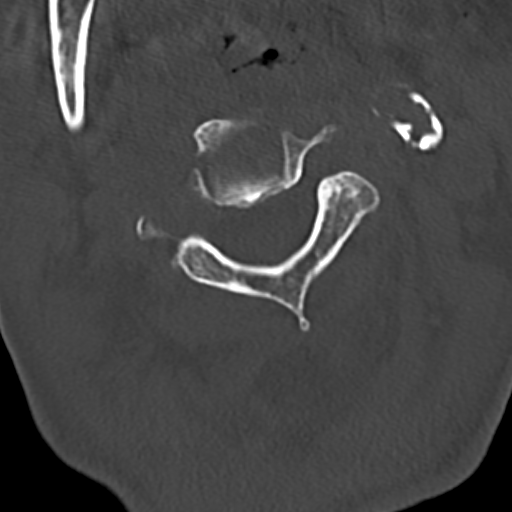

[13 of 33 positions shown; findings below may reference images not displayed]

FINDINGS: CT HEAD FINDINGS

Brain: Generalized atrophy. Moderate to advanced chronic small
vessel ischemia. Remote lacunar infarct in the left cerebellum and
basal ganglia. No intracranial hemorrhage, mass effect, or midline
shift. No hydrocephalus. The basilar cisterns are patent. No
evidence of territorial infarct or acute ischemia. No extra-axial or
intracranial fluid collection.

Vascular: Atherosclerosis of skullbase vasculature without
hyperdense vessel or abnormal calcification.

Skull: No fracture or suspicious lesion. Probable focal hyperostosis
in the right frontal region.

Sinuses/Orbits: Bilateral globe deformity, more prominent on the
left with intra-ocular high-density material, calcifications versus
surgical material. Mild mucosal thickening of the paranasal sinuses.
The mastoid air cells are clear. Postsurgical change of the nose.

Other: None.

CT CERVICAL SPINE FINDINGS

Alignment: No traumatic subluxation. Trace anterolisthesis of C4 on
C5 appears degenerative.

Skull base and vertebrae: No acute fracture. Vertebral body heights
are maintained. The dens and skull base are intact.

Soft tissues and spinal canal: No spinal canal hematoma. Fluid in
the hypopharynx.

Disc levels: Disc space narrowing and endplate spurring C4-C5.
Multilevel facet arthropathy.

Upper chest: Large bilateral pleural effusions.

Other: Carotid calcifications.
IMPRESSION: 1. No acute intracranial abnormality. No skull fracture.
2. Generalized atrophy and chronic small vessel ischemia. Remote
lacunar infarcts in the left cerebellum and basal ganglia.
3. Degenerative change in the cervical spine without acute fracture
or subluxation.
4. Bilateral pleural effusions, as seen on chest radiograph earlier
this day.
